# Patient Record
Sex: Female | Born: 1935 | Race: White | Hispanic: No | Marital: Married | Smoking: Former smoker
Health system: Southern US, Community
[De-identification: ages and names within clinical notes are randomized; demographics above are authoritative.]

## PROBLEM LIST (undated history)

## (undated) DIAGNOSIS — K219 Gastro-esophageal reflux disease without esophagitis: Secondary | ICD-10-CM

## (undated) DIAGNOSIS — I10 Essential (primary) hypertension: Secondary | ICD-10-CM

## (undated) DIAGNOSIS — E785 Hyperlipidemia, unspecified: Secondary | ICD-10-CM

## (undated) DIAGNOSIS — E039 Hypothyroidism, unspecified: Secondary | ICD-10-CM

## (undated) HISTORY — PX: RETINAL TEAR REPAIR CRYOTHERAPY: SHX5304

## (undated) HISTORY — PX: CATARACT EXTRACTION: SUR2

## (undated) HISTORY — PX: BACK SURGERY: SHX140

## (undated) HISTORY — PX: ABDOMINAL HYSTERECTOMY: SHX81

---

## 2009-08-07 ENCOUNTER — Inpatient Hospital Stay: Payer: Self-pay | Admitting: Specialist

## 2011-05-26 ENCOUNTER — Ambulatory Visit: Payer: Self-pay

## 2013-07-26 ENCOUNTER — Emergency Department: Payer: Self-pay | Admitting: Emergency Medicine

## 2013-07-26 LAB — COMPREHENSIVE METABOLIC PANEL
Albumin: 3.8 g/dL (ref 3.4–5.0)
Alkaline Phosphatase: 61 U/L (ref 50–136)
Anion Gap: 4 — ABNORMAL LOW (ref 7–16)
BUN: 18 mg/dL (ref 7–18)
Bilirubin,Total: 0.4 mg/dL (ref 0.2–1.0)
Calcium, Total: 10.3 mg/dL — ABNORMAL HIGH (ref 8.5–10.1)
Chloride: 105 mmol/L (ref 98–107)
Co2: 28 mmol/L (ref 21–32)
Creatinine: 1.34 mg/dL — ABNORMAL HIGH (ref 0.60–1.30)
EGFR (African American): 44 — ABNORMAL LOW
Glucose: 113 mg/dL — ABNORMAL HIGH (ref 65–99)
Osmolality: 277 (ref 275–301)
Potassium: 4.3 mmol/L (ref 3.5–5.1)
Total Protein: 7.6 g/dL (ref 6.4–8.2)

## 2013-07-26 LAB — URINALYSIS, COMPLETE
Bilirubin,UR: NEGATIVE
Blood: NEGATIVE
Ketone: NEGATIVE
Nitrite: NEGATIVE
Ph: 6 (ref 4.5–8.0)
RBC,UR: 2 /HPF (ref 0–5)
Specific Gravity: 1.01 (ref 1.003–1.030)
WBC UR: 17 /HPF (ref 0–5)

## 2013-07-26 LAB — CBC
HCT: 35.8 % (ref 35.0–47.0)
MCHC: 35 g/dL (ref 32.0–36.0)
MCV: 96 fL (ref 80–100)
Platelet: 221 10*3/uL (ref 150–440)
RBC: 3.74 10*6/uL — ABNORMAL LOW (ref 3.80–5.20)

## 2013-07-26 LAB — CK TOTAL AND CKMB (NOT AT ARMC): CK-MB: 1.6 ng/mL (ref 0.5–3.6)

## 2013-11-15 HISTORY — PX: OTHER SURGICAL HISTORY: SHX169

## 2013-12-14 ENCOUNTER — Ambulatory Visit: Payer: Self-pay

## 2014-02-06 ENCOUNTER — Ambulatory Visit: Payer: Self-pay | Admitting: General Practice

## 2014-02-06 LAB — PROTIME-INR
INR: 1
Prothrombin Time: 12.9 secs (ref 11.5–14.7)

## 2014-02-06 LAB — BASIC METABOLIC PANEL
Anion Gap: 4 — ABNORMAL LOW (ref 7–16)
BUN: 24 mg/dL — AB (ref 7–18)
Calcium, Total: 10 mg/dL (ref 8.5–10.1)
Chloride: 106 mmol/L (ref 98–107)
Co2: 28 mmol/L (ref 21–32)
Creatinine: 1.41 mg/dL — ABNORMAL HIGH (ref 0.60–1.30)
EGFR (African American): 41 — ABNORMAL LOW
EGFR (Non-African Amer.): 36 — ABNORMAL LOW
GLUCOSE: 92 mg/dL (ref 65–99)
OSMOLALITY: 279 (ref 275–301)
Potassium: 4.2 mmol/L (ref 3.5–5.1)
Sodium: 138 mmol/L (ref 136–145)

## 2014-02-06 LAB — URINALYSIS, COMPLETE
BILIRUBIN, UR: NEGATIVE
Bacteria: NONE SEEN
Blood: NEGATIVE
Glucose,UR: NEGATIVE mg/dL (ref 0–75)
KETONE: NEGATIVE
Leukocyte Esterase: NEGATIVE
Nitrite: NEGATIVE
Ph: 6 (ref 4.5–8.0)
Protein: NEGATIVE
RBC, UR: NONE SEEN /HPF (ref 0–5)
Specific Gravity: 1.006 (ref 1.003–1.030)
Squamous Epithelial: <1

## 2014-02-06 LAB — MRSA PCR SCREENING

## 2014-02-06 LAB — CBC
HCT: 35.2 % (ref 35.0–47.0)
HGB: 11.9 g/dL — AB (ref 12.0–16.0)
MCH: 32.9 pg (ref 26.0–34.0)
MCHC: 34 g/dL (ref 32.0–36.0)
MCV: 97 fL (ref 80–100)
Platelet: 198 10*3/uL (ref 150–440)
RBC: 3.64 10*6/uL — AB (ref 3.80–5.20)
RDW: 13.4 % (ref 11.5–14.5)
WBC: 7.7 10*3/uL (ref 3.6–11.0)

## 2014-02-06 LAB — SEDIMENTATION RATE: Erythrocyte Sed Rate: 42 mm/hr — ABNORMAL HIGH (ref 0–30)

## 2014-02-06 LAB — APTT: Activated PTT: 33.1 secs (ref 23.6–35.9)

## 2014-02-08 LAB — URINE CULTURE

## 2014-02-20 ENCOUNTER — Inpatient Hospital Stay: Payer: Self-pay | Admitting: General Practice

## 2014-02-21 LAB — BASIC METABOLIC PANEL
Anion Gap: 5 — ABNORMAL LOW (ref 7–16)
BUN: 24 mg/dL — AB (ref 7–18)
CALCIUM: 8.9 mg/dL (ref 8.5–10.1)
Chloride: 105 mmol/L (ref 98–107)
Co2: 26 mmol/L (ref 21–32)
Creatinine: 1.41 mg/dL — ABNORMAL HIGH (ref 0.60–1.30)
EGFR (African American): 41 — ABNORMAL LOW
EGFR (Non-African Amer.): 36 — ABNORMAL LOW
GLUCOSE: 115 mg/dL — AB (ref 65–99)
OSMOLALITY: 277 (ref 275–301)
POTASSIUM: 4.1 mmol/L (ref 3.5–5.1)
SODIUM: 136 mmol/L (ref 136–145)

## 2014-02-21 LAB — PLATELET COUNT: PLATELETS: 177 10*3/uL (ref 150–440)

## 2014-02-21 LAB — HEMOGLOBIN: HGB: 9.8 g/dL — ABNORMAL LOW (ref 12.0–16.0)

## 2014-02-22 LAB — BASIC METABOLIC PANEL
ANION GAP: 6 — AB (ref 7–16)
BUN: 23 mg/dL — ABNORMAL HIGH (ref 7–18)
CO2: 26 mmol/L (ref 21–32)
Calcium, Total: 9.3 mg/dL (ref 8.5–10.1)
Chloride: 108 mmol/L — ABNORMAL HIGH (ref 98–107)
Creatinine: 1.28 mg/dL (ref 0.60–1.30)
EGFR (Non-African Amer.): 40 — ABNORMAL LOW
GFR CALC AF AMER: 46 — AB
Glucose: 86 mg/dL (ref 65–99)
Osmolality: 282 (ref 275–301)
Potassium: 4.5 mmol/L (ref 3.5–5.1)
SODIUM: 140 mmol/L (ref 136–145)

## 2014-02-22 LAB — PLATELET COUNT: Platelet: 173 10*3/uL (ref 150–440)

## 2014-02-22 LAB — HEMOGLOBIN: HGB: 9.8 g/dL — ABNORMAL LOW (ref 12.0–16.0)

## 2015-03-08 NOTE — Op Note (Signed)
PATIENT NAME:  Gail Mora, Gail Mora MR#:  [id number] DATE OF BIRTH:  1933/02/27  DATE OF PROCEDURE:  02/20/2014  PREOPERATIVE DIAGNOSIS: Degenerative arthrosis of the left knee.   POSTOPERATIVE DIAGNOSIS: Degenerative arthrosis of the left knee.   PROCEDURE PERFORMED: Left total knee arthroplasty using computer-assisted navigation.   SURGEON: Illene LabradorJames P. Angie FavaHooten Jr., MD  ASSISTANT: Cranston Neighborhris Gaines, PA-C (required to maintain retraction throughout the procedure)   ANESTHESIA: Spinal.   ESTIMATED BLOOD LOSS: 50 mL.   FLUIDS REPLACED: 1300 mL of crystalloid.   TOURNIQUET TIME: 96 minutes.   DRAINS: Two medium drains to reinfusion system.   SOFT TISSUE RELEASES: Anterior cruciate ligament, posterior cruciate ligament, deep and superficial medial collateral ligament and patellofemoral ligament.   IMPLANTS UTILIZED: DePuy PFC Sigma size 2.5 posterior stabilized femoral component (cemented), size 3 MBT tibial component (cemented), 32 mm 3 peg oval dome patella (cemented), and 10 mm stabilized rotating platform polyethylene insert.   INDICATIONS FOR SURGERY: The patient is a 81 year old female who has been seen for complaints of progressive left knee pain. X-rays demonstrated severe degenerative changes in a tricompartmental fashion with relative varus deformity. After discussion of the risks and benefits of surgical intervention, the patient expressed understanding of the risks and benefits and agreed with plans for surgical intervention.   PROCEDURE IN DETAIL: The patient was brought into the operating room and, after adequate spinal anesthesia was achieved, a tourniquet was placed on the patient's upper left thigh. The patient's left knee and leg were cleaned and prepped with alcohol and DuraPrep and draped in the usual sterile fashion. A "timeout" was performed as per usual protocol. The left lower extremity was exsanguinated using an Esmarch, and the tourniquet was inflated to 300 mmHg. An anterior  longitudinal incision was made followed by a standard mid vastus approach. A moderate effusion was evacuated. The deep fibers of the medial collateral ligament were elevated in a subperiosteal fashion off the medial flare of the tibia so as to maintain a continuous soft tissue sleeve. The patella was subluxed laterally, and the patellofemoral ligament was incised. Inspection of the knee demonstrated severe degenerative changes with full-thickness loss of articular cartilage. Prominent osteophytes were debrided using a rongeur. Anterior and posterior cruciate ligaments were excised. Two 4.0 mm Schanz pins were inserted into the femur and into the tibia for attachment of the array of trackers used for computer-assisted navigation. Hip center was identified using the circumduction technique. Distal landmarks were mapped using the computer. The distal femur and proximal tibia were mapped using the computer. Distal femoral cutting guide was positioned using computer-assisted navigation so as to achieve a 5 degree distal valgus cut. Cut was performed and verified using the computer. Distal femur was sized, and it was felt that a size 2.5 femoral component was appropriate. A size 2.5 cutting guide was positioned, and anterior cut was performed and verified using the computer. This was followed by completion of the posterior and chamfer cuts. Femoral cutting guide for a central box was then positioned, and the central box cut was performed. Attention was then directed to the proximal tibia. Medial and lateral menisci were excised. The extramedullary tibial cutting guide was positioned so as to achieve 0 degree varus and valgus alignment and 0 degree posterior slope using computer navigation. The proximal tibia was cut and confirmed using the computer. The proximal tibia was sized, and it was felt that a size 3 tibial tray was appropriate. Tibial and femoral trials were positioned. Posterior osteophytes off of  the femoral  condyles were removed using curved osteotomes. A 10 mm polyethylene trial was inserted, and the knee was placed through range of motion. The knee was felt to be tight medially. A Cobb elevator was used to elevate the superficial fibers of the medial collateral ligament. This allowed for excellent medial and lateral soft tissue balancing. Finally, the patella was cut and prepared so as to accommodate a 32 mm 3 peg oval dome patella. Patellar trial was placed, and the knee was placed through a range of motion, with excellent patellar tracking appreciated. The femoral trial was removed. Central post hole for the tibial component was reamed, followed by insertion of a keel punch. Tibial trial was then removed. The cut surfaces of bone were irrigated with copious amounts of normal saline with antibiotic solution using pulsatile lavage and then suctioned dry. Polymethyl methacrylate cement was prepared in the usual fashion using a vacuum mixer. Cement was applied to the cut surface of the proximal tibia as well as along the undersurface of a size 3 MBT tibial component. The tibial component was positioned and impacted into place. Excess cement was removed using Personal assistant. Cement was then applied to the cut surface of the femur as well as along the posterior flanges of a size 2.5 posterior stabilized femoral component. The femoral component was positioned and impacted into place. Excess cement was removed using Personal assistant. A 10 mm polyethylene trial was inserted, and the knee was brought into full extension with steady axial compression. Finally, cement was applied to the backside of a 32 mm 3 peg oval dome patella, and the patellar component was positioned and patellar clamp applied. Excess cement was removed using Personal assistant. After adequate curing of cement, the tourniquet was deflated after a total tourniquet time of 96 minutes. Hemostasis was achieved using electrocautery. The knee was irrigated with  copious amounts of normal saline with antibiotic solution using pulsatile lavage and then suctioned dry. The knee was inspected for any residual cement debris. Then, 20 mL of 1.3% Exparel in 40 mL of normal saline was injected along the posterior capsule, medial and lateral gutters and along the arthrotomy site. A 10 mm stabilized rotating platform polyethylene insert was inserted, and the knee was placed through a range of motion, with excellent patellar tracking appreciated and excellent medial and lateral soft tissue balancing noted. Two medium drains were placed in the wound bed and brought out through a separate stab incision to be attached to a reinfusion system. The medial parapatellar portion of the incision was reapproximated using interrupted sutures of #1 Vicryl. The subcutaneous tissue was approximated in layers using first #0 Vicryl, followed by 2-0 Vicryl. Then, 30 mL of 0.25% Marcaine with epinephrine was injected along the subcutaneous tissue along the incision site. The skin was reapproximated using skin staples. A sterile dressing was applied.   The patient tolerated the procedure well. She was transported to the recovery room in stable condition.   ____________________________ Illene Labrador. Angie Fava., MD jph:lb D: 02/21/2014 06:32:45 ET T: 02/21/2014 06:44:59 ET JOB#: [id number]  cc: Illene Labrador. Angie Fava., MD, <Dictator> Illene Labrador Angie Fava MD ELECTRONICALLY SIGNED 03/06/2014 11:33

## 2015-03-08 NOTE — Discharge Summary (Signed)
PATIENT NAME:  Gail Mora, Gail Mora MR#:  [id number] DATE OF BIRTH:  10-Nov-1933  DATE OF ADMISSION:  02/20/2014 DATE OF DISCHARGE:  02/23/2014  ADMITTING DIAGNOSIS:  Degenerative arthritis, left knee.   DISCHARGE DIAGNOSIS:  Degenerative arthritis, left knee.   PROCEDURE PERFORMED:  Left total knee arthroplasty using computer-assisted navigation.   SURGEON:  Laurice Record. Holley Bouche., M.D.   ASSISTANT:  Rachelle Hora, PA-C.   ANESTHESIA:  Spinal.   ESTIMATED BLOOD LOSS:  50 mL.   FLUIDS REPLACED:  1300 mL of crystalloid.   TOURNIQUET TIME:  96 minutes.   DRAINS:  Two medium drains to reinfusion system.   SOFT TISSUE RELEASES:  Anterior cruciate ligament, posterior cruciate ligament, deep and superficial medial collateral ligament and patellofemoral ligament.   IMPLANTS UTILIZED:  DePuy PFC Sigma size 2.5 posterior stabilized femoral component with a size three MBT tibial component and a 32 mm three peg oval dome patella, and 10 mm stabilized rotating platform polyethylene insert.   HISTORY:  The patient is a 81 year old female who presents today for history and physical.  She is to undergo left total knee arthroplasty on 02/20/2014.  She presented with orthopedics from Dr. Juanda Crumble Phillip's office.  She has had a three year history of progressive left knee pain.  Pain is aggravated by weight-bearing activities although she reports some nighttime pain.  She localizes most of the knee pain along the medial aspect of the knee.  She is not currently using any ambulatory aid.  She does report some occasional mild swelling of the knee, but denies any gross locking or giving way of the knee.  She has seen some slight symptomatic improvement with the use of an analgesic cream.  The pain has progressed to the point that it is significantly interfering with activities of daily living.   PHYSICAL EXAMINATION: GENERAL:  Well-developed, well-nourished female in no apparent distress.  Normal affect.  Antalgic  gait with varus thrust of the left knee.  HEENT:  Atraumatic, normocephalic.  Pupils are equal, round, reactive to light.  Extraocular motion is intact.  Sclerae is clear.  Oropharynx is clear with moist mucosa.  NECK:  Supple, nontender with good range of motion.  No thyromegaly, adenopathy, JVD or carotid bruits.  LUNGS:  Clear to auscultation bilaterally.  CARDIOVASCULAR:  Regular rate and rhythm.  Normal S1, S2.  2 out of 6 murmur.  No appreciable gallops or rubs.  Peripheral pulses are palpable.  No lower extremity edema.  Homans test is negative.  ABDOMEN:  Soft, nontender, nondistended.  Bowel sounds are present.  LEFT KNEE:  No swelling, warmth, erythema or effusion.  She has crepitus with range of motion tenderness along the medial joint line and she has a relative varus alignment.  She has medial joint line tenderness. She has no laxity with a negative anterior drawer test, Lachman's test as well as pivot shift test and McMurray's test.  She has no atrophy and good quadriceps tone.  Range of motion is 3 to 114 degrees and she is neurovascularly intact in the left lower extremity.   HOSPITAL COURSE:  The patient was admitted to the hospital 02/20/2014.  She had surgery that same day and was brought to the orthopedic floor from the PAC-U in stable condition.  On postoperative day 1 she was found to have acute postop blood loss anemia of 9.8 and on postoperative day 2, this stayed stable at 9.8.  Creatinine was slightly elevated, but was at normal for her baseline  at 1.41.  She continued to receive IV fluids and her creatinine was down to 1.28 on postoperative day 2.  The patient did make great progress with physical therapy on postoperative day 1 and 2 and on postoperative day 3, she met all physical therapy goals and was stable and ready for discharge to home with home health PT.   CONDITION AT DISCHARGE:  Stable.   DISCHARGE INSTRUCTIONS:  The patient may gradually increase weight-bearing on  the affected extremity, elevate the affected foot or leg on 1 or 2 pillows with the foot higher than the knee.  Thigh-high TED hose on both legs and remove at bedtime and replace when arising the next morning.  Elevate the heels off the bed.  Use incentive spirometry every hour while awake.  Encourage cough and deep breathing.  Continue using Polar Care unit, maintaining temp between 40 to 50 degrees.  Do not get the dressing or bandage wet or dirty.  Call [facility] ortho if the dressing gets water under it.  Leave the dressing on.  Call [facility] ortho if you experience any increased bleeding from the incision wound, fever above 101.5 degrees, redness, swelling, or drainage at the incision site.  Call [facility] ortho if any increased leg pain, numbness or weakness in her legs or bowel or bladder symptoms.  Home physical therapy has been arranged for continuation of rehab program.  Please call [place] ortho if a therapist has not contacted you within 48 hours of your return home.  Follow-up appointment on 03/07/2014 at 9:00 a.m. with Dr. Skip Estimable.   DISCHARGE MEDICATIONS:  See discharge medications from discharge instructions.    ____________________________ T. Rachelle Hora, PA-C tcg:ea D: 02/22/2014 19:39:25 ET T: 02/22/2014 23:53:07 ET JOB#: [id number]  cc: T. Rachelle Hora, PA-C, <Dictator> Duanne Guess [place] ELECTRONICALLY SIGNED 03/08/2014 8:09

## 2015-11-30 ENCOUNTER — Encounter: Payer: Self-pay | Admitting: Emergency Medicine

## 2015-11-30 ENCOUNTER — Emergency Department: Payer: Medicare Other

## 2015-11-30 ENCOUNTER — Emergency Department
Admission: EM | Admit: 2015-11-30 | Discharge: 2015-11-30 | Disposition: A | Discharge status: Home / Self Care | Payer: Medicare Other | Attending: Emergency Medicine | Admitting: Emergency Medicine

## 2015-11-30 DIAGNOSIS — R002 Palpitations: Secondary | ICD-10-CM | POA: Diagnosis not present

## 2015-11-30 DIAGNOSIS — F419 Anxiety disorder, unspecified: Secondary | ICD-10-CM | POA: Diagnosis not present

## 2015-11-30 DIAGNOSIS — R0602 Shortness of breath: Secondary | ICD-10-CM | POA: Diagnosis not present

## 2015-11-30 DIAGNOSIS — I10 Essential (primary) hypertension: Secondary | ICD-10-CM | POA: Insufficient documentation

## 2015-11-30 DIAGNOSIS — Z87891 Personal history of nicotine dependence: Secondary | ICD-10-CM | POA: Insufficient documentation

## 2015-11-30 HISTORY — DX: Essential (primary) hypertension: I10

## 2015-11-30 HISTORY — DX: Hyperlipidemia, unspecified: E78.5

## 2015-11-30 HISTORY — DX: Hypothyroidism, unspecified: E03.9

## 2015-11-30 HISTORY — DX: Gastro-esophageal reflux disease without esophagitis: K21.9

## 2015-11-30 LAB — COMPREHENSIVE METABOLIC PANEL
ALBUMIN: 4.1 g/dL (ref 3.5–5.0)
ALT: 11 U/L — ABNORMAL LOW (ref 14–54)
ANION GAP: 6 (ref 5–15)
AST: 18 U/L (ref 15–41)
Alkaline Phosphatase: 44 U/L (ref 38–126)
BUN: 27 mg/dL — AB (ref 6–20)
CALCIUM: 10.3 mg/dL (ref 8.9–10.3)
CHLORIDE: 106 mmol/L (ref 101–111)
CO2: 26 mmol/L (ref 22–32)
CREATININE: 1.43 mg/dL — AB (ref 0.44–1.00)
GFR calc Af Amer: 39 mL/min — ABNORMAL LOW (ref >60)
GFR, EST NON AFRICAN AMERICAN: 34 mL/min — AB (ref >60)
GLUCOSE: 109 mg/dL — AB (ref 65–99)
POTASSIUM: 5.1 mmol/L (ref 3.5–5.1)
Sodium: 138 mmol/L (ref 135–145)
TOTAL PROTEIN: 7.6 g/dL (ref 6.5–8.1)
Total Bilirubin: 0.7 mg/dL (ref 0.3–1.2)

## 2015-11-30 LAB — CBC
HCT: 34.3 % — ABNORMAL LOW (ref 35.0–47.0)
Hemoglobin: 11.5 g/dL — ABNORMAL LOW (ref 12.0–16.0)
MCH: 31.9 pg (ref 26.0–34.0)
MCHC: 33.6 g/dL (ref 32.0–36.0)
MCV: 94.8 fL (ref 80.0–100.0)
PLATELETS: 203 10*3/uL (ref 150–440)
RBC: 3.62 MIL/uL — ABNORMAL LOW (ref 3.80–5.20)
RDW: 13.5 % (ref 11.5–14.5)
WBC: 7.6 10*3/uL (ref 3.6–11.0)

## 2015-11-30 LAB — URINALYSIS COMPLETE WITH MICROSCOPIC (ARMC ONLY)
BILIRUBIN URINE: NEGATIVE
GLUCOSE, UA: NEGATIVE mg/dL
HGB URINE DIPSTICK: NEGATIVE
Ketones, ur: NEGATIVE mg/dL
Leukocytes, UA: NEGATIVE
Nitrite: NEGATIVE
Protein, ur: NEGATIVE mg/dL
RBC / HPF: NONE SEEN RBC/hpf (ref 0–5)
Specific Gravity, Urine: 1.006 (ref 1.005–1.030)
pH: 6 (ref 5.0–8.0)

## 2015-11-30 LAB — TROPONIN I

## 2015-11-30 NOTE — ED Notes (Signed)
Pt states feels jittery and "weird". Denies chest pain. Pt states occasionally feels "chest flutters". Denies numbness or tingling of extremities. Denies dizziness or lightheadedness. Denies N/V/D. States she feels anxious because she does not know what is making her feel this way but denies anxiety prior to symptom onset.

## 2015-11-30 NOTE — ED Provider Notes (Signed)
[facility] Emergency Department Provider Note  Time seen: 12:13 PM  I have reviewed the triage vital signs and the nursing notes.   HISTORY  Chief Complaint Shortness of Breath    HPI Gail Mora is a 82 y.o. female with a past medical history of hypertension, hypothyroidism, hyperlipidemia, gastric reflux who presents the emergency department with a "weird feeling." According to the patient this morning around 8:15 she was sitting in her chair when she began feeling jittery, anxious, and said she felt a few palpitations. She states she got up and showered trying to get ready for the day but continued to feel "weird." Patient denies any pain, specifically denies chest pain, trouble breathing, abdominal pain, nausea, vomiting, diarrhea, dysuria. Patient states she had a similar symptom approximately one year ago and ended up being diagnosed with urinary tract infection.  Patient went to a fire department nearby, they told her that her blood pressure was elevated and referred her to the emergency department. Patient states she still feels a little jittery but much better than earlier today. States she just wanted to be safe so she came for an evaluation.       Past Medical History  Diagnosis Date  . Hypertension   . Hypothyroid   . Hyperlipemia   . GERD (gastroesophageal reflux disease)     There are no active problems to display for this patient.   Past Surgical History  Procedure Laterality Date  . Knee replacemt  2015    Left knee  . Abdominal hysterectomy      No current outpatient prescriptions on file.  Allergies Review of patient's allergies indicates no known allergies.  No family history on file.  Social History Social History  Substance Use Topics  . Smoking status: Former Games developermoker  . Smokeless tobacco: Never Used  . Alcohol Use: No    Review of Systems Constitutional: Negative for fever. Cardiovascular: Negative for chest  pain. Respiratory: Negative for shortness of breath. Gastrointestinal: Negative for abdominal pain, vomiting and diarrhea. Genitourinary: Negative for dysuria. Neurological: Negative for headache 10-point ROS otherwise negative.  ____________________________________________   PHYSICAL EXAM:  VITAL SIGNS: ED Triage Vitals  Enc Vitals Group     BP 11/30/15 1006 159/78 mmHg     Pulse Rate 11/30/15 1006 68     Resp 11/30/15 1006 18     Temp 11/30/15 1006 97.8 F (36.6 C)     Temp Source 11/30/15 1006 Oral     SpO2 11/30/15 1006 97 %     Weight 11/30/15 1006 148 lb (67.132 kg)     Height 11/30/15 1006 5\' 2"  (1.575 m)     Head Cir --      Peak Flow --      Pain Score --      Pain Loc --      Pain Edu? --      Excl. in GC? --     Constitutional: Alert and oriented. Well appearing and in no distress. Eyes: Normal exam ENT   Head: Normocephalic and atraumatic.   Mouth/Throat: Mucous membranes are moist. Cardiovascular: Normal rate, regular rhythm. Respiratory: Normal respiratory effort without tachypnea nor retractions. Breath sounds are clear and equal bilaterally. No wheezes/rales/rhonchi. Gastrointestinal: Soft and nontender. No distention.  Musculoskeletal: Nontender with normal range of motion in all extremities. No lower extremity tenderness or edema. Neurologic:  Normal speech and language. No gross focal neurologic deficits Skin:  Skin is warm, dry and intact.  Psychiatric:  Mood and affect are normal. Speech and behavior are normal.  ____________________________________________    EKG  EKG reviewed and interpreted by myself shows sinus rhythm at 61 bpm, narrow QRS, normal axis, normal intervals, no ST changes. Overall normal EKG besides occasional PVC.  ____________________________________________    RADIOLOGY  chest x-ray shows no acute abnormality  INITIAL IMPRESSION / ASSESSMENT AND PLAN / ED COURSE  Pertinent labs & imaging results that were  available during my care of the patient were reviewed by me and considered in my medical decision making (see chart for details).   patient presents for a weird sensation is morning. On review of systems denies nearly everything including chest pain, shortness of breath, weakness or numbness, headache. Patient's labs are within normal limits besides a slight hyperkalemia. Given the patient's onset at 8:15 AM we will repeat a troponin now as it has been 2 hours since the first lab was drawn. We will also obtain a urinalysis given the patient experienced similar symptoms one year ago with urinary tract infection. Overall the patient appears very well, with a normal exam.  Repeat troponin and urinalysis normal. We will discharge the patient home with primary care follow-up. The patient is agreeable to plan. ____________________________________________   FINAL CLINICAL IMPRESSION(S) / ED DIAGNOSES  Palpitations   Minna Antis, MD 11/30/15 [phone]

## 2015-11-30 NOTE — Discharge Instructions (Signed)
You have been seen in the emergency department today, your workup has shown normal results. You do have an occasional ectopic beat on your EKG which is likely the heart fluttering sensation you are experiencing. Labs are normal, your chest x-ray is normal. Please follow-up with her primary care physician in one to 2 days for recheck/reevaluation. Return to the emergency department for any further concerning symptoms, especially for any chest pain or trouble breathing.   Palpitations A palpitation is the feeling that your heartbeat is irregular or is faster than normal. It may feel like your heart is fluttering or skipping a beat. Palpitations are usually not a serious problem. However, in some cases, you may need further medical evaluation. CAUSES  Palpitations can be caused by:  Smoking.  Caffeine or other stimulants, such as diet pills or energy drinks.  Alcohol.  Stress and anxiety.  Strenuous physical activity.  Fatigue.  Certain medicines.  Heart disease, especially if you have a history of irregular heart rhythms (arrhythmias), such as atrial fibrillation, atrial flutter, or supraventricular tachycardia.  An improperly working pacemaker or defibrillator. DIAGNOSIS  To find the cause of your palpitations, your health care provider will take your medical history and perform a physical exam. Your health care provider may also have you take a test called an ambulatory electrocardiogram (ECG). An ECG records your heartbeat patterns over a 24-hour period. You may also have other tests, such as:  Transthoracic echocardiogram (TTE). During echocardiography, sound waves are used to evaluate how blood flows through your heart.  Transesophageal echocardiogram (TEE).  Cardiac monitoring. This allows your health care provider to monitor your heart rate and rhythm in real time.  Holter monitor. This is a portable device that records your heartbeat and can help diagnose heart arrhythmias. It  allows your health care provider to track your heart activity for several days, if needed.  Stress tests by exercise or by giving medicine that makes the heart beat faster. TREATMENT  Treatment of palpitations depends on the cause of your symptoms and can vary greatly. Most cases of palpitations do not require any treatment other than time, relaxation, and monitoring your symptoms. Other causes, such as atrial fibrillation, atrial flutter, or supraventricular tachycardia, usually require further treatment. HOME CARE INSTRUCTIONS   Avoid:  Caffeinated coffee, tea, soft drinks, diet pills, and energy drinks.  Chocolate.  Alcohol.  Stop smoking if you smoke.  Reduce your stress and anxiety. Things that can help you relax include:  A method of controlling things in your body, such as your heartbeats, with your mind (biofeedback).  Yoga.  Meditation.  Physical activity such as swimming, jogging, or walking.  Get plenty of rest and sleep. SEEK MEDICAL CARE IF:   You continue to have a fast or irregular heartbeat beyond 24 hours.  Your palpitations occur more often. SEEK IMMEDIATE MEDICAL CARE IF:  You have chest pain or shortness of breath.  You have a severe headache.  You feel dizzy or you faint. MAKE SURE YOU:  Understand these instructions.  Will watch your condition.  Will get help right away if you are not doing well or get worse.   This information is not intended to replace advice given to you by your health care provider. Make sure you discuss any questions you have with your health care provider.   Document Released: 10/29/2000 Document Revised: 11/06/2013 Document Reviewed: 12/31/2011 Elsevier Interactive Patient Education [employer]

## 2015-11-30 NOTE — ED Notes (Signed)
Pt reports SOB that started after you woke up yesterday. Pt states she also felt palpitations at the time. She states " I just  feel weird"  denies chest pain.

## 2015-12-31 DIAGNOSIS — H35342 Macular cyst, hole, or pseudohole, left eye: Secondary | ICD-10-CM | POA: Diagnosis not present

## 2016-01-05 DIAGNOSIS — I119 Hypertensive heart disease without heart failure: Secondary | ICD-10-CM | POA: Diagnosis not present

## 2016-01-05 DIAGNOSIS — E034 Atrophy of thyroid (acquired): Secondary | ICD-10-CM | POA: Diagnosis not present

## 2016-01-05 DIAGNOSIS — E784 Other hyperlipidemia: Secondary | ICD-10-CM | POA: Diagnosis not present

## 2016-01-12 DIAGNOSIS — E785 Hyperlipidemia, unspecified: Secondary | ICD-10-CM | POA: Diagnosis not present

## 2016-01-12 DIAGNOSIS — E039 Hypothyroidism, unspecified: Secondary | ICD-10-CM | POA: Diagnosis not present

## 2016-04-28 DIAGNOSIS — J45909 Unspecified asthma, uncomplicated: Secondary | ICD-10-CM | POA: Diagnosis not present

## 2016-04-28 DIAGNOSIS — J449 Chronic obstructive pulmonary disease, unspecified: Secondary | ICD-10-CM | POA: Diagnosis not present

## 2016-04-28 DIAGNOSIS — J219 Acute bronchiolitis, unspecified: Secondary | ICD-10-CM | POA: Diagnosis not present

## 2016-04-28 DIAGNOSIS — E784 Other hyperlipidemia: Secondary | ICD-10-CM | POA: Diagnosis not present

## 2016-05-05 DIAGNOSIS — E785 Hyperlipidemia, unspecified: Secondary | ICD-10-CM | POA: Diagnosis not present

## 2016-05-05 DIAGNOSIS — E039 Hypothyroidism, unspecified: Secondary | ICD-10-CM | POA: Diagnosis not present

## 2016-05-09 ENCOUNTER — Emergency Department
Admission: EM | Admit: 2016-05-09 | Discharge: 2016-05-09 | Disposition: A | Discharge status: Home / Self Care | Payer: Medicare Other | Attending: Student | Admitting: Student

## 2016-05-09 ENCOUNTER — Emergency Department: Payer: Medicare Other

## 2016-05-09 ENCOUNTER — Encounter: Payer: Self-pay | Admitting: Emergency Medicine

## 2016-05-09 ENCOUNTER — Other Ambulatory Visit: Payer: Self-pay

## 2016-05-09 DIAGNOSIS — E785 Hyperlipidemia, unspecified: Secondary | ICD-10-CM | POA: Diagnosis not present

## 2016-05-09 DIAGNOSIS — R05 Cough: Secondary | ICD-10-CM | POA: Diagnosis not present

## 2016-05-09 DIAGNOSIS — Z87891 Personal history of nicotine dependence: Secondary | ICD-10-CM | POA: Insufficient documentation

## 2016-05-09 DIAGNOSIS — I10 Essential (primary) hypertension: Secondary | ICD-10-CM | POA: Insufficient documentation

## 2016-05-09 DIAGNOSIS — M25511 Pain in right shoulder: Secondary | ICD-10-CM | POA: Insufficient documentation

## 2016-05-09 DIAGNOSIS — R0789 Other chest pain: Secondary | ICD-10-CM | POA: Diagnosis not present

## 2016-05-09 DIAGNOSIS — R079 Chest pain, unspecified: Secondary | ICD-10-CM | POA: Diagnosis not present

## 2016-05-09 DIAGNOSIS — E039 Hypothyroidism, unspecified: Secondary | ICD-10-CM | POA: Insufficient documentation

## 2016-05-09 DIAGNOSIS — R0602 Shortness of breath: Secondary | ICD-10-CM | POA: Diagnosis not present

## 2016-05-09 LAB — CBC
HEMATOCRIT: 30.9 % — AB (ref 35.0–47.0)
HEMOGLOBIN: 10.4 g/dL — AB (ref 12.0–16.0)
MCH: 31.5 pg (ref 26.0–34.0)
MCHC: 33.7 g/dL (ref 32.0–36.0)
MCV: 93.2 fL (ref 80.0–100.0)
Platelets: 242 10*3/uL (ref 150–440)
RBC: 3.31 MIL/uL — ABNORMAL LOW (ref 3.80–5.20)
RDW: 13.9 % (ref 11.5–14.5)
WBC: 7.8 10*3/uL (ref 3.6–11.0)

## 2016-05-09 LAB — BASIC METABOLIC PANEL
ANION GAP: 8 (ref 5–15)
BUN: 21 mg/dL — AB (ref 6–20)
CO2: 27 mmol/L (ref 22–32)
Calcium: 11 mg/dL — ABNORMAL HIGH (ref 8.9–10.3)
Chloride: 103 mmol/L (ref 101–111)
Creatinine, Ser: 1.36 mg/dL — ABNORMAL HIGH (ref 0.44–1.00)
GFR calc Af Amer: 41 mL/min — ABNORMAL LOW (ref >60)
GFR, EST NON AFRICAN AMERICAN: 35 mL/min — AB (ref >60)
GLUCOSE: 104 mg/dL — AB (ref 65–99)
POTASSIUM: 4.3 mmol/L (ref 3.5–5.1)
Sodium: 138 mmol/L (ref 135–145)

## 2016-05-09 LAB — TROPONIN I
Troponin I: <0.03 ng/mL (ref <0.031)
Troponin I: <0.03 ng/mL (ref <0.031)

## 2016-05-09 LAB — URINALYSIS COMPLETE WITH MICROSCOPIC (ARMC ONLY)
BACTERIA UA: NONE SEEN
Bilirubin Urine: NEGATIVE
Glucose, UA: NEGATIVE mg/dL
Hgb urine dipstick: NEGATIVE
KETONES UR: NEGATIVE mg/dL
Nitrite: NEGATIVE
PH: 7 (ref 5.0–8.0)
PROTEIN: NEGATIVE mg/dL
RBC / HPF: NONE SEEN RBC/hpf (ref 0–5)
SQUAMOUS EPITHELIAL / LPF: NONE SEEN
Specific Gravity, Urine: 1.006 (ref 1.005–1.030)
WBC UA: NONE SEEN WBC/hpf (ref 0–5)

## 2016-05-09 MED ORDER — IBUPROFEN 400 MG PO TABS
400.0000 mg | ORAL_TABLET | Freq: Once | ORAL | Status: DC
  Filled 2016-05-09: qty 1

## 2016-05-09 MED ORDER — ASPIRIN 81 MG PO CHEW
324.0000 mg | CHEWABLE_TABLET | Freq: Once | ORAL | Status: AC
  Administered 2016-05-09: 243 mg via ORAL
  Filled 2016-05-09: qty 4

## 2016-05-09 MED ORDER — ACETAMINOPHEN 325 MG PO TABS
650.0000 mg | ORAL_TABLET | Freq: Once | ORAL | Status: AC
  Administered 2016-05-09: 650 mg via ORAL
  Filled 2016-05-09: qty 2

## 2016-05-09 NOTE — ED Provider Notes (Signed)
[facility] Emergency Department Provider Note   ____________________________________________  Time seen: Approximately 3:04 PM  I have reviewed the triage vital signs and the nursing notes.   HISTORY  Chief Complaint Chest Pain and Shortness of Breath    HPI Gail Mora is a 82 y.o. female with thyroid disorder, hypertension, hyperlipidemia and GERD who presents for evaluation of 2-3 days of intermittent right shoulder pain and one day of right upper chest pain today gradual onset, worse with movement, mild to moderate. Patient reports that over the past 3 days she has had right shoulder pain worse with movement which she attributes to arthritis. Today she began having pain in the right upper chest wall while she was fixing her hair and getting ready to go to church. She reports that the pain is achy, typically lasts for only a few seconds and then goes away. It is not worsened with exertion, is not pleuritic, does not radiate to the back or down towards the feet and is not ripping or tearing in nature. No sudden nausea or diaphoresis. She reports she has been short of breath over the past week but she attributes that to the fact that she has been having productive cough and having nasal congestion and she just finished a Z-Pak for "congestion". She denies any fevers. No abdominal pain, no vomiting or diarrhea. No history of coronary artery disease.   Past Medical History  Diagnosis Date  . Hypertension   . Hypothyroid   . Hyperlipemia   . GERD (gastroesophageal reflux disease)     There are no active problems to display for this patient.   Past Surgical History  Procedure Laterality Date  . Knee replacemt  2015    Left knee  . Abdominal hysterectomy      No current outpatient prescriptions on file.  Allergies Review of patient's allergies indicates no known allergies.  No family history on file.  Social History Social History  Substance  Use Topics  . Smoking status: Former Games developer  . Smokeless tobacco: Never Used  . Alcohol Use: No    Review of Systems Constitutional: No fever/chills Eyes: No visual changes. ENT: No sore throat. Cardiovascular: + chest pain. Respiratory: + shortness of breath. Gastrointestinal: No abdominal pain.  No nausea, no vomiting.  No diarrhea.  No constipation. Genitourinary: Negative for dysuria. Musculoskeletal: Negative for back pain. Skin: Negative for rash. Neurological: Negative for headaches, focal weakness or numbness.  10-point ROS otherwise negative.  ____________________________________________   PHYSICAL EXAM:  VITAL SIGNS: ED Triage Vitals  Enc Vitals Group     BP 05/09/16 1433 154/67 mmHg     Pulse Rate 05/09/16 1433 59     Resp 05/09/16 1433 19     Temp --      Temp src --      SpO2 05/09/16 1433 100 %     Weight --      Height --      Head Cir --      Peak Flow --      Pain Score 05/09/16 1353 0     Pain Loc --      Pain Edu? --      Excl. in GC? --     Constitutional: Alert and oriented. Well appearing and in no acute distress. Eyes: Conjunctivae are normal. PERRL. EOMI. Head: Atraumatic. Nose: No congestion/rhinnorhea. Mouth/Throat: Mucous membranes are moist.  Oropharynx non-erythematous. Neck: No stridor.  Supple without meningismus. Cardiovascular: Normal rate,  regular rhythm. Grossly normal heart sounds.  Good peripheral circulation. Respiratory: Normal respiratory effort.  No retractions. Lungs CTAB. Gastrointestinal: Soft and nontender. No distention.  No CVA tenderness. Genitourinary: deferred Musculoskeletal: No lower extremity tenderness nor edema.  No joint effusions. swelling/tenderness or asymmetry. There is significant point tenderness to palpation throughout the right anterior shoulder and just adjacent to the shoulder in the right upper chest wall, when I palpate the area the patient grimaces in pain and attempts to move away from the  painful stimulus. 2+ radial pulses bilaterally. Full though moderately painful range of motion at the right shoulder. Neurologic:  Normal speech and language. No gross focal neurologic deficits are appreciated. No gait instability. Skin:  Skin is warm, dry and intact. No rash noted. Psychiatric: Mood and affect are normal. Speech and behavior are normal.  ____________________________________________   LABS (all labs ordered are listed, but only abnormal results are displayed)  Labs Reviewed  BASIC METABOLIC PANEL - Abnormal; Notable for the following:    Glucose, Bld 104 (*)    BUN 21 (*)    Creatinine, Ser 1.36 (*)    Calcium 11.0 (*)    GFR calc non Af Amer 35 (*)    GFR calc Af Amer 41 (*)    All other components within normal limits  CBC - Abnormal; Notable for the following:    RBC 3.31 (*)    Hemoglobin 10.4 (*)    HCT 30.9 (*)    All other components within normal limits  URINALYSIS COMPLETEWITH MICROSCOPIC ([facility] ONLY) - Abnormal; Notable for the following:    Color, Urine STRAW (*)    APPearance CLEAR (*)    Leukocytes, UA 1+ (*)    All other components within normal limits  URINE CULTURE  TROPONIN I  TROPONIN I   ____________________________________________  EKG  ED ECG REPORT I, Gayla DossGayle, Khristen Cheyney A, the attending physician, personally viewed and interpreted this ECG.   Date: 05/09/2016  EKG Time: 13:55  Rate: 67  Rhythm: normal EKG, normal sinus rhythm with sinus arrhythmia.  Axis: normal  Intervals:none  ST&T Change: No acute ST elevation.  ____________________________________________  RADIOLOGY  CXR IMPRESSION: No acute cardiopulmonary process. ____________________________________________   PROCEDURES  Procedure(s) performed: None  Critical Care performed: No  ____________________________________________   INITIAL IMPRESSION / ASSESSMENT AND PLAN / ED COURSE  Pertinent labs & imaging results that were available during my care of the patient  were reviewed by me and considered in my medical decision making (see chart for details).  Gail Mora is a 82 y.o. female with thyroid disorder, hypertension, hyperlipidemia and GERD who presents for evaluation of 2-3 days of intermittent right shoulder pain and one day of right upper chest pain today gradual onset. On exam, she is very well-appearing and in no acute distress. Her vital signs are stable and she is afebrile. During my interview, she denied any pain however she reported to me that after I examined her and pushed on her right upper chest wall, that's when she developed pain. I suspect her pain is musculoskeletal in nature, it is certainly worse with movement and she has reproducible point tenderness. Her EKG is normal, not consistent with acute ischemia and her clinical picture would be quite atypical for ACS however given her age and risk factors for same, we'll obtain 2 sets of cardiac enzymes. We'll obtain screening labs, chest x-ray, give aspirin. Clinical picture is not consistent with acute aortic dissection or pulmonary embolism. Reassess for disposition.  -----------------------------------------  5:27 PM on 05/09/2016 ----------------------------------------- Patient continues to rest comfortably, she is in no distress and has no complaints. Pain improved with Motrin and Tylenol. Chest x-ray with no acute cardiopulmonary disease. I reviewed her labs which show chronic stable abnormalities including mild creatinine elevation and anemia though her creatinine and hemoglobin levels appear close to what they were when her levels were checked in January of this year. Urinalysis with 1+ leukocytes but otherwise not consistent with infection and she has no urinary complaints, would send culture and wait to treat if her cultures come back positive. 2 sets of troponins are negative, I doubt that this represents ACS. We discussed return precautions, need for close PCP follow-up and she is  comfortable with the discharge plan. DC home. ____________________________________________   FINAL CLINICAL IMPRESSION(S) / ED DIAGNOSES  Final diagnoses:  Right shoulder pain  Right-sided chest wall pain      NEW MEDICATIONS STARTED DURING THIS VISIT:  New Prescriptions   No medications on file     Note:  This document was prepared using Dragon voice recognition software and may include unintentional dictation errors.    Gayla DossEryka A Jacobi Ryant, MD 05/09/16 [phone]

## 2016-05-09 NOTE — ED Notes (Signed)
Pt presents to ED with reports of right chest pain that radiates to her right arm. Pt reports SOB. Pt states pain comes and goes.

## 2016-05-10 LAB — URINE CULTURE

## 2016-05-11 DIAGNOSIS — E039 Hypothyroidism, unspecified: Secondary | ICD-10-CM | POA: Diagnosis not present

## 2016-05-11 DIAGNOSIS — E785 Hyperlipidemia, unspecified: Secondary | ICD-10-CM | POA: Diagnosis not present

## 2016-05-11 DIAGNOSIS — R0789 Other chest pain: Secondary | ICD-10-CM | POA: Diagnosis not present

## 2016-05-25 DIAGNOSIS — J45909 Unspecified asthma, uncomplicated: Secondary | ICD-10-CM | POA: Diagnosis not present

## 2016-05-25 DIAGNOSIS — R079 Chest pain, unspecified: Secondary | ICD-10-CM | POA: Diagnosis not present

## 2016-05-25 DIAGNOSIS — J219 Acute bronchiolitis, unspecified: Secondary | ICD-10-CM | POA: Diagnosis not present

## 2016-05-25 DIAGNOSIS — E039 Hypothyroidism, unspecified: Secondary | ICD-10-CM | POA: Diagnosis not present

## 2016-05-25 DIAGNOSIS — J449 Chronic obstructive pulmonary disease, unspecified: Secondary | ICD-10-CM | POA: Diagnosis not present

## 2016-05-27 DIAGNOSIS — J219 Acute bronchiolitis, unspecified: Secondary | ICD-10-CM | POA: Diagnosis not present

## 2016-05-27 DIAGNOSIS — J45909 Unspecified asthma, uncomplicated: Secondary | ICD-10-CM | POA: Diagnosis not present

## 2016-05-27 DIAGNOSIS — E039 Hypothyroidism, unspecified: Secondary | ICD-10-CM | POA: Diagnosis not present

## 2016-05-27 DIAGNOSIS — R079 Chest pain, unspecified: Secondary | ICD-10-CM | POA: Diagnosis not present

## 2016-08-05 DIAGNOSIS — E785 Hyperlipidemia, unspecified: Secondary | ICD-10-CM | POA: Diagnosis not present

## 2016-08-05 DIAGNOSIS — E039 Hypothyroidism, unspecified: Secondary | ICD-10-CM | POA: Diagnosis not present

## 2016-08-05 DIAGNOSIS — J209 Acute bronchitis, unspecified: Secondary | ICD-10-CM | POA: Diagnosis not present

## 2016-08-06 DIAGNOSIS — I10 Essential (primary) hypertension: Secondary | ICD-10-CM | POA: Diagnosis not present

## 2016-08-06 DIAGNOSIS — E784 Other hyperlipidemia: Secondary | ICD-10-CM | POA: Diagnosis not present

## 2016-08-06 DIAGNOSIS — R5381 Other malaise: Secondary | ICD-10-CM | POA: Diagnosis not present

## 2016-08-11 DIAGNOSIS — D649 Anemia, unspecified: Secondary | ICD-10-CM | POA: Diagnosis not present

## 2016-08-11 DIAGNOSIS — E785 Hyperlipidemia, unspecified: Secondary | ICD-10-CM | POA: Diagnosis not present

## 2016-08-11 DIAGNOSIS — E039 Hypothyroidism, unspecified: Secondary | ICD-10-CM | POA: Diagnosis not present

## 2016-09-07 DIAGNOSIS — I10 Essential (primary) hypertension: Secondary | ICD-10-CM | POA: Diagnosis not present

## 2016-09-07 DIAGNOSIS — E034 Atrophy of thyroid (acquired): Secondary | ICD-10-CM | POA: Diagnosis not present

## 2016-09-07 DIAGNOSIS — E784 Other hyperlipidemia: Secondary | ICD-10-CM | POA: Diagnosis not present

## 2016-09-07 DIAGNOSIS — R5381 Other malaise: Secondary | ICD-10-CM | POA: Diagnosis not present

## 2016-09-10 DIAGNOSIS — E039 Hypothyroidism, unspecified: Secondary | ICD-10-CM | POA: Diagnosis not present

## 2016-09-10 DIAGNOSIS — D649 Anemia, unspecified: Secondary | ICD-10-CM | POA: Diagnosis not present

## 2016-09-10 DIAGNOSIS — Z23 Encounter for immunization: Secondary | ICD-10-CM | POA: Diagnosis not present

## 2016-09-10 DIAGNOSIS — E785 Hyperlipidemia, unspecified: Secondary | ICD-10-CM | POA: Diagnosis not present

## 2016-09-20 DIAGNOSIS — H35342 Macular cyst, hole, or pseudohole, left eye: Secondary | ICD-10-CM | POA: Diagnosis not present

## 2016-12-14 DIAGNOSIS — E785 Hyperlipidemia, unspecified: Secondary | ICD-10-CM | POA: Diagnosis not present

## 2016-12-14 DIAGNOSIS — D649 Anemia, unspecified: Secondary | ICD-10-CM | POA: Diagnosis not present

## 2016-12-14 DIAGNOSIS — E039 Hypothyroidism, unspecified: Secondary | ICD-10-CM | POA: Diagnosis not present

## 2016-12-14 DIAGNOSIS — I10 Essential (primary) hypertension: Secondary | ICD-10-CM | POA: Diagnosis not present

## 2017-05-09 DIAGNOSIS — E034 Atrophy of thyroid (acquired): Secondary | ICD-10-CM | POA: Diagnosis not present

## 2017-05-09 DIAGNOSIS — E785 Hyperlipidemia, unspecified: Secondary | ICD-10-CM | POA: Diagnosis not present

## 2017-05-09 DIAGNOSIS — I10 Essential (primary) hypertension: Secondary | ICD-10-CM | POA: Diagnosis not present

## 2017-05-09 DIAGNOSIS — R5381 Other malaise: Secondary | ICD-10-CM | POA: Diagnosis not present

## 2017-05-09 DIAGNOSIS — E039 Hypothyroidism, unspecified: Secondary | ICD-10-CM | POA: Diagnosis not present

## 2017-05-09 DIAGNOSIS — D649 Anemia, unspecified: Secondary | ICD-10-CM | POA: Diagnosis not present

## 2017-05-09 DIAGNOSIS — E784 Other hyperlipidemia: Secondary | ICD-10-CM | POA: Diagnosis not present

## 2017-05-13 DIAGNOSIS — D649 Anemia, unspecified: Secondary | ICD-10-CM | POA: Diagnosis not present

## 2017-05-13 DIAGNOSIS — I10 Essential (primary) hypertension: Secondary | ICD-10-CM | POA: Diagnosis not present

## 2017-05-13 DIAGNOSIS — E039 Hypothyroidism, unspecified: Secondary | ICD-10-CM | POA: Diagnosis not present

## 2017-05-13 DIAGNOSIS — E785 Hyperlipidemia, unspecified: Secondary | ICD-10-CM | POA: Diagnosis not present

## 2017-05-23 DIAGNOSIS — H35342 Macular cyst, hole, or pseudohole, left eye: Secondary | ICD-10-CM | POA: Diagnosis not present

## 2017-06-11 DIAGNOSIS — R300 Dysuria: Secondary | ICD-10-CM | POA: Diagnosis not present

## 2017-06-11 DIAGNOSIS — N3090 Cystitis, unspecified without hematuria: Secondary | ICD-10-CM | POA: Diagnosis not present

## 2017-06-14 DIAGNOSIS — D649 Anemia, unspecified: Secondary | ICD-10-CM | POA: Diagnosis not present

## 2017-06-14 DIAGNOSIS — R079 Chest pain, unspecified: Secondary | ICD-10-CM | POA: Diagnosis not present

## 2017-06-14 DIAGNOSIS — E039 Hypothyroidism, unspecified: Secondary | ICD-10-CM | POA: Diagnosis not present

## 2017-06-14 DIAGNOSIS — I10 Essential (primary) hypertension: Secondary | ICD-10-CM | POA: Diagnosis not present

## 2017-06-16 ENCOUNTER — Encounter: Payer: Self-pay | Admitting: Emergency Medicine

## 2017-06-16 ENCOUNTER — Emergency Department
Admission: EM | Admit: 2017-06-16 | Discharge: 2017-06-16 | Disposition: A | Discharge status: Home / Self Care | Payer: Medicare Other | Attending: Emergency Medicine | Admitting: Emergency Medicine

## 2017-06-16 ENCOUNTER — Emergency Department: Payer: Medicare Other

## 2017-06-16 DIAGNOSIS — E039 Hypothyroidism, unspecified: Secondary | ICD-10-CM | POA: Diagnosis not present

## 2017-06-16 DIAGNOSIS — I10 Essential (primary) hypertension: Secondary | ICD-10-CM | POA: Diagnosis not present

## 2017-06-16 DIAGNOSIS — R079 Chest pain, unspecified: Secondary | ICD-10-CM | POA: Diagnosis not present

## 2017-06-16 DIAGNOSIS — R0789 Other chest pain: Secondary | ICD-10-CM | POA: Diagnosis not present

## 2017-06-16 DIAGNOSIS — Z96652 Presence of left artificial knee joint: Secondary | ICD-10-CM | POA: Insufficient documentation

## 2017-06-16 DIAGNOSIS — Z87891 Personal history of nicotine dependence: Secondary | ICD-10-CM | POA: Diagnosis not present

## 2017-06-16 LAB — CBC
HEMATOCRIT: 35.1 % (ref 35.0–47.0)
HEMOGLOBIN: 12.3 g/dL (ref 12.0–16.0)
MCH: 33.5 pg (ref 26.0–34.0)
MCHC: 35.1 g/dL (ref 32.0–36.0)
MCV: 95.4 fL (ref 80.0–100.0)
Platelets: 237 10*3/uL (ref 150–440)
RBC: 3.68 MIL/uL — ABNORMAL LOW (ref 3.80–5.20)
RDW: 13.3 % (ref 11.5–14.5)
WBC: 7.5 10*3/uL (ref 3.6–11.0)

## 2017-06-16 LAB — BASIC METABOLIC PANEL
Anion gap: 7 (ref 5–15)
BUN: 20 mg/dL (ref 6–20)
CO2: 26 mmol/L (ref 22–32)
Calcium: 10.1 mg/dL (ref 8.9–10.3)
Chloride: 103 mmol/L (ref 101–111)
Creatinine, Ser: 1.23 mg/dL — ABNORMAL HIGH (ref 0.44–1.00)
GFR calc Af Amer: 46 mL/min — ABNORMAL LOW (ref >60)
GFR, EST NON AFRICAN AMERICAN: 40 mL/min — AB (ref >60)
GLUCOSE: 107 mg/dL — AB (ref 65–99)
POTASSIUM: 3.9 mmol/L (ref 3.5–5.1)
Sodium: 136 mmol/L (ref 135–145)

## 2017-06-16 LAB — TROPONIN I: Troponin I: <0.03 ng/mL (ref <0.03)

## 2017-06-16 NOTE — Discharge Instructions (Signed)
Be sure to get the tests you have scheduled tomorrow. Please return for worse chest pain or shortness of breath in the meantime. Please follow-up with your doctor as planned.

## 2017-06-16 NOTE — ED Triage Notes (Signed)
Pt c/o central/right chest discomfort since about Tuesday this week. Reports starting abx over the weekend for UTI.  Saw PCP Tuesday she thinks and EKG done. Supposed to have echo/stress test tomorrow but started again today with chest pains.  Does have some SHOB with exertion.  Unlabored currently. NAD

## 2017-06-16 NOTE — ED Provider Notes (Signed)
[facility] Emergency Department Provider Note   ____________________________________________   First MD Initiated Contact with Patient 06/16/17 1412     (approximate)  I have reviewed the triage vital signs and the nursing notes.   HISTORY  Chief Complaint Chest Pain    HPI Gail Mora is a 83 y.o. female who reports several days of pain in the right side of her chest and her right arm. She reports shortness of breath with exertion but does not have any chest pain with exertion. She does not have any chest pain with movement of the arm she is not short of breath when she has the chest pain she does not have any unusual cough. Her baseline shortness of breath has not changed in months. She thinks maybe the pain is from arthritis. It feels something like the arthritis she's had in the past. She got a shot for in her arm recently that helped.   Past Medical History:  Diagnosis Date  . GERD (gastroesophageal reflux disease)   . Hyperlipemia   . Hypertension   . Hypothyroid     There are no active problems to display for this patient.   Past Surgical History:  Procedure Laterality Date  . ABDOMINAL HYSTERECTOMY    . Knee replacemt  2015   Left knee    Prior to Admission medications   Not on File    Allergies Patient has no known allergies.  History reviewed. No pertinent family history.  Social History Social History  Substance Use Topics  . Smoking status: Former Games developermoker  . Smokeless tobacco: Never Used  . Alcohol use No    Review of Systems  Constitutional: No fever/chills Eyes: No visual changes. ENT: No sore throat. Cardiovascular: Denies chest pain. Respiratory: Denies shortness of breath. Gastrointestinal: No abdominal pain.  No nausea, no vomiting.  No diarrhea.  No constipation. Genitourinary: Negative for dysuria. Musculoskeletal: Negative for back pain. Skin: Negative for rash. Neurological: Negative for headaches,  focal weakness  ____________________________________________   PHYSICAL EXAM:  VITAL SIGNS: ED Triage Vitals [06/16/17 1110]  Enc Vitals Group     BP (!) 164/60     Pulse Rate (!) 57     Resp 18     Temp 97.9 F (36.6 C)     Temp Source Oral     SpO2 97 %     Weight 155 lb (70.3 kg)     Height 5\' 2"  (1.575 m)     Head Circumference      Peak Flow      Pain Score 6     Pain Loc      Pain Edu?      Excl. in GC?     Constitutional: Alert and oriented. Well appearing and in no acute distress. Eyes: Conjunctivae are normal. PERRL. EOMI. Head: Atraumatic. Nose: No congestion/rhinnorhea. Mouth/Throat: Mucous membranes are moist.  Oropharynx non-erythematous. Neck: No stridor.   Cardiovascular: Normal rate, regular rhythm. Grossly normal heart sounds.  Good peripheral circulation. Respiratory: Normal respiratory effort.  No retractions. Lungs CTAB. Gastrointestinal: Soft and nontender. No distention. No abdominal bruits. No CVA tenderness. Musculoskeletal: No lower extremity tenderness nor edema.  No joint effusions. Neurologic:  Normal speech and language. No gross focal neurologic deficits are appreciated. No gait instability. Skin:  Skin is warm, dry and intact. No rash noted. Psychiatric: Mood and affect are normal. Speech and behavior are normal.  ____________________________________________   LABS (all labs ordered are listed, but only  abnormal results are displayed)  Labs Reviewed  BASIC METABOLIC PANEL - Abnormal; Notable for the following:       Result Value   Glucose, Bld 107 (*)    Creatinine, Ser 1.23 (*)    GFR calc non Af Amer 40 (*)    GFR calc Af Amer 46 (*)    All other components within normal limits  CBC - Abnormal; Notable for the following:    RBC 3.68 (*)    All other components within normal limits  TROPONIN I  TROPONIN I   ____________________________________________  EKG  EKG read and interpreted by me shows normal sinus rhythm rate of  60 normal axis normal EKG ____________________________________________  RADIOLOGY  Dg Chest 2 View  Result Date: 06/16/2017 CLINICAL DATA:  Chest pain. EXAM: CHEST  2 VIEW COMPARISON:  Radiographs of May 09, 2016 FINDINGS: The heart size and mediastinal contours are within normal limits. Atherosclerosis of thoracic aorta is noted. Both lungs are clear. No pneumothorax or pleural effusion is noted. The visualized skeletal structures are unremarkable. IMPRESSION: No active cardiopulmonary disease.  Aortic atherosclerosis. Electronically Signed   By: Lupita RaiderJames  Green Jr, M.D.   On: 06/16/2017 11:32    ____________________________________________   PROCEDURES  Procedure(s) performed:   Procedures  Critical Care performed:   ____________________________________________   INITIAL IMPRESSION / ASSESSMENT AND PLAN / ED COURSE  Pertinent labs & imaging results that were available during my care of the patient were reviewed by me and considered in my medical decision making (see chart for details).  Serial troponins are negative the rest of her lab work is negative she has cardiac testing scheduled for tomorrow and I'll think I can improve upon that. Not sure that this pain is indeed cardiac. I will discharge her and let her get her testing as scheduled.      ____________________________________________   FINAL CLINICAL IMPRESSION(S) / ED DIAGNOSES  Final diagnoses:  Nonspecific chest pain      NEW MEDICATIONS STARTED DURING THIS VISIT:  New Prescriptions   No medications on file     Note:  This document was prepared using Dragon voice recognition software and may include unintentional dictation errors.    Arnaldo NatalMalinda, Paul F, MD 06/16/17 [phone]

## 2017-06-17 DIAGNOSIS — K219 Gastro-esophageal reflux disease without esophagitis: Secondary | ICD-10-CM | POA: Diagnosis not present

## 2017-06-17 DIAGNOSIS — E039 Hypothyroidism, unspecified: Secondary | ICD-10-CM | POA: Diagnosis not present

## 2017-06-17 DIAGNOSIS — R0602 Shortness of breath: Secondary | ICD-10-CM | POA: Diagnosis not present

## 2017-06-17 DIAGNOSIS — E785 Hyperlipidemia, unspecified: Secondary | ICD-10-CM | POA: Diagnosis not present

## 2017-06-17 DIAGNOSIS — D649 Anemia, unspecified: Secondary | ICD-10-CM | POA: Diagnosis not present

## 2017-07-04 ENCOUNTER — Ambulatory Visit: Payer: Self-pay | Admitting: Urology

## 2017-07-05 ENCOUNTER — Encounter: Payer: Self-pay | Admitting: Urology

## 2017-07-05 ENCOUNTER — Ambulatory Visit (INDEPENDENT_AMBULATORY_CARE_PROVIDER_SITE_OTHER): Payer: Medicare Other | Admitting: Urology

## 2017-07-05 DIAGNOSIS — R300 Dysuria: Secondary | ICD-10-CM

## 2017-07-05 LAB — MICROSCOPIC EXAMINATION
Bacteria, UA: NONE SEEN
RBC MICROSCOPIC, UA: NONE SEEN /HPF (ref 0–?)
WBC UA: NONE SEEN /HPF (ref 0–?)

## 2017-07-05 LAB — URINALYSIS, COMPLETE
Bilirubin, UA: NEGATIVE
GLUCOSE, UA: NEGATIVE
KETONES UA: NEGATIVE
Leukocytes, UA: NEGATIVE
NITRITE UA: NEGATIVE
Protein, UA: NEGATIVE
RBC UA: NEGATIVE
SPEC GRAV UA: 1.02 (ref 1.005–1.030)
UUROB: 0.2 mg/dL (ref 0.2–1.0)
pH, UA: 5.5 (ref 5.0–7.5)

## 2017-07-05 NOTE — Progress Notes (Signed)
07/05/2017 3:47 PM   Gail Mora November 18, 1932 [id number]  Referring provider: Corky Downs, MD [address] [place], [place] [address]  Chief Complaint  Patient presents with  . Dysuria    HPI: Patient referred for urinary tract infection. She developed dysuria about 3 weeks ago. Her UA showed rare bacteria, greater than 50 white blood cells and no red blood cells. Her urine culture grew Klebsiella sensitive to cefazolin, Bactrim and Cipro. It was intermediate sensitivity to nitrofurantoin so she was switched from that to Augmentin. She took Azo. Her UA today clear. There is no bacteria nor white blood cells or red blood cells. She does not get a lot of UTI's. Maybe one per year. No kidney stones, gross hematuria or flank pain. No GU hx or surgery. She had a Hx. She wears a pad as she might leak when she sneezes or coughs. Uses 0-1 ppd. She voids with a good flow and feels empty. Dysuria improved. She hasn't had a recent breast exam and doesn't need pelvics.   PMH: Past Medical History:  Diagnosis Date  . GERD (gastroesophageal reflux disease)   . Hyperlipemia   . Hypertension   . Hypothyroid     Surgical History: Past Surgical History:  Procedure Laterality Date  . ABDOMINAL HYSTERECTOMY    . BACK SURGERY    . CATARACT EXTRACTION    . Knee replacemt  2015   Left knee  . RETINAL TEAR REPAIR CRYOTHERAPY      Home Medications:  Allergies as of 07/05/2017      Reactions   Codeine Nausea Only      Medication List       Accurate as of 07/05/17  3:47 PM. Always use your most recent med list.          allopurinol 100 MG tablet Commonly known as:  ZYLOPRIM   ALPRAZolam 0.25 MG tablet Commonly known as:  XANAX   aspirin EC 81 MG tablet Take by mouth.   levothyroxine 100 MCG tablet Commonly known as:  SYNTHROID, LEVOTHROID   lisinopril-hydrochlorothiazide 20-25 MG tablet Commonly known as:  PRINZIDE,ZESTORETIC   ranitidine 150 MG tablet Commonly known as:   ZANTAC Take by mouth.   simvastatin 40 MG tablet Commonly known as:  ZOCOR       Allergies:  Allergies  Allergen Reactions  . Codeine Nausea Only    Family History: Family History  Problem Relation Age of Onset  . Bladder Cancer Neg Hx   . Kidney cancer Neg Hx     Social History:  reports that she has quit smoking. She has never used smokeless tobacco. She reports that she does not drink alcohol or use drugs.  ROS: UROLOGY Frequent Urination?: Yes Hard to postpone urination?: No Burning/pain with urination?: Yes Get up at night to urinate?: Yes Leakage of urine?: No Urine stream starts and stops?: No Trouble starting stream?: No Do you have to strain to urinate?: No Blood in urine?: No Urinary tract infection?: Yes Sexually transmitted disease?: No Injury to kidneys or bladder?: No Painful intercourse?: No Weak stream?: No Currently pregnant?: No Vaginal bleeding?: No Last menstrual period?: n  Gastrointestinal Nausea?: No Vomiting?: No Indigestion/heartburn?: Yes Diarrhea?: No Constipation?: No  Constitutional Fever: No Night sweats?: No Weight loss?: No Fatigue?: No  Skin Skin rash/lesions?: No Itching?: No  Eyes Blurred vision?: No Double vision?: No  Ears/Nose/Throat Sore throat?: No Sinus problems?: No  Hematologic/Lymphatic Swollen glands?: No Easy bruising?: No  Cardiovascular Leg swelling?: No  Chest pain?: No  Respiratory Cough?: Yes Shortness of breath?: No  Endocrine Excessive thirst?: No  Musculoskeletal Back pain?: Yes Joint pain?: Yes  Neurological Headaches?: No Dizziness?: No  Psychologic Depression?: No Anxiety?: No  Physical Exam: There were no vitals taken for this visit.  Constitutional:  Alert and oriented, No acute distress. HEENT: [place] AT, moist mucus membranes.  Trachea midline, no masses. Cardiovascular: No clubbing, cyanosis, or edema. Respiratory: Normal respiratory effort, no increased work  of breathing. GI: Abdomen is soft, nontender, nondistended, no abdominal masses GU: No CVA tenderness. Skin: No rashes, bruises or suspicious lesions. Lymph: No cervical or inguinal adenopathy. Neurologic: Grossly intact, no focal deficits, moving all 4 extremities. Psychiatric: Normal mood and affect.  Laboratory Data: Lab Results  Component Value Date   WBC 7.5 06/16/2017   HGB 12.3 06/16/2017   HCT 35.1 06/16/2017   MCV 95.4 06/16/2017   PLT 237 06/16/2017    Lab Results  Component Value Date   CREATININE 1.23 (H) 06/16/2017    No results found for: PSA  No results found for: TESTOSTERONE  No results found for: HGBA1C  Urinalysis    Component Value Date/Time   COLORURINE STRAW (A) 05/09/2016 1538   APPEARANCEUR CLEAR (A) 05/09/2016 1538   APPEARANCEUR Clear 02/06/2014 0912   LABSPEC 1.006 05/09/2016 1538   LABSPEC 1.006 02/06/2014 0912   PHURINE 7.0 05/09/2016 1538   GLUCOSEU NEGATIVE 05/09/2016 1538   GLUCOSEU Negative 02/06/2014 0912   HGBUR NEGATIVE 05/09/2016 1538   BILIRUBINUR NEGATIVE 05/09/2016 1538   BILIRUBINUR Negative 02/06/2014 0912   KETONESUR NEGATIVE 05/09/2016 1538   PROTEINUR NEGATIVE 05/09/2016 1538   NITRITE NEGATIVE 05/09/2016 1538   LEUKOCYTESUR 1+ (A) 05/09/2016 1538   LEUKOCYTESUR Negative 02/06/2014 0912    Assessment & Plan:    1. Dysuria - related to a UTI and improved. I don't see any indication for further imaging or cystoscopy. If she has continued dysuria or vaginal irritation she could try TV estrogen but she needs routine breast exam. I'll send a message to Dr. Juel Burrow.  - Urinalysis, Complete   No Follow-up on file.  Jerilee Field, MD  [facility] [address], [address] [place], [place] [address] [phone]

## 2017-09-21 DIAGNOSIS — H35342 Macular cyst, hole, or pseudohole, left eye: Secondary | ICD-10-CM | POA: Diagnosis not present

## 2017-09-27 DIAGNOSIS — E785 Hyperlipidemia, unspecified: Secondary | ICD-10-CM | POA: Diagnosis not present

## 2017-09-27 DIAGNOSIS — J209 Acute bronchitis, unspecified: Secondary | ICD-10-CM | POA: Diagnosis not present

## 2017-09-27 DIAGNOSIS — M1990 Unspecified osteoarthritis, unspecified site: Secondary | ICD-10-CM | POA: Diagnosis not present

## 2017-09-27 DIAGNOSIS — K219 Gastro-esophageal reflux disease without esophagitis: Secondary | ICD-10-CM | POA: Diagnosis not present

## 2018-01-04 DIAGNOSIS — I10 Essential (primary) hypertension: Secondary | ICD-10-CM | POA: Diagnosis not present

## 2018-01-04 DIAGNOSIS — D649 Anemia, unspecified: Secondary | ICD-10-CM | POA: Diagnosis not present

## 2018-01-04 DIAGNOSIS — M67911 Unspecified disorder of synovium and tendon, right shoulder: Secondary | ICD-10-CM | POA: Diagnosis not present

## 2018-01-04 DIAGNOSIS — E039 Hypothyroidism, unspecified: Secondary | ICD-10-CM | POA: Diagnosis not present

## 2018-01-04 DIAGNOSIS — M1990 Unspecified osteoarthritis, unspecified site: Secondary | ICD-10-CM | POA: Diagnosis not present

## 2018-04-11 DIAGNOSIS — N3000 Acute cystitis without hematuria: Secondary | ICD-10-CM | POA: Diagnosis not present

## 2018-04-11 DIAGNOSIS — M67911 Unspecified disorder of synovium and tendon, right shoulder: Secondary | ICD-10-CM | POA: Diagnosis not present

## 2018-04-11 DIAGNOSIS — N390 Urinary tract infection, site not specified: Secondary | ICD-10-CM | POA: Diagnosis not present

## 2018-04-11 DIAGNOSIS — D649 Anemia, unspecified: Secondary | ICD-10-CM | POA: Diagnosis not present

## 2018-05-04 ENCOUNTER — Encounter: Payer: Self-pay | Admitting: Medical Oncology

## 2018-05-04 ENCOUNTER — Other Ambulatory Visit: Payer: Self-pay

## 2018-05-04 ENCOUNTER — Emergency Department: Payer: Medicare Other

## 2018-05-04 ENCOUNTER — Emergency Department
Admission: EM | Admit: 2018-05-04 | Discharge: 2018-05-04 | Disposition: A | Discharge status: Home / Self Care | Payer: Medicare Other | Attending: Emergency Medicine | Admitting: Emergency Medicine

## 2018-05-04 DIAGNOSIS — R0789 Other chest pain: Secondary | ICD-10-CM | POA: Diagnosis not present

## 2018-05-04 DIAGNOSIS — E039 Hypothyroidism, unspecified: Secondary | ICD-10-CM | POA: Diagnosis not present

## 2018-05-04 DIAGNOSIS — I10 Essential (primary) hypertension: Secondary | ICD-10-CM | POA: Diagnosis not present

## 2018-05-04 DIAGNOSIS — R0602 Shortness of breath: Secondary | ICD-10-CM | POA: Insufficient documentation

## 2018-05-04 DIAGNOSIS — Z79899 Other long term (current) drug therapy: Secondary | ICD-10-CM | POA: Insufficient documentation

## 2018-05-04 DIAGNOSIS — R079 Chest pain, unspecified: Secondary | ICD-10-CM | POA: Insufficient documentation

## 2018-05-04 DIAGNOSIS — Z87891 Personal history of nicotine dependence: Secondary | ICD-10-CM | POA: Diagnosis not present

## 2018-05-04 LAB — CBC
HCT: 35.4 % (ref 35.0–47.0)
Hemoglobin: 12.6 g/dL (ref 12.0–16.0)
MCH: 34.2 pg — ABNORMAL HIGH (ref 26.0–34.0)
MCHC: 35.5 g/dL (ref 32.0–36.0)
MCV: 96.2 fL (ref 80.0–100.0)
PLATELETS: 222 10*3/uL (ref 150–440)
RBC: 3.68 MIL/uL — AB (ref 3.80–5.20)
RDW: 13.3 % (ref 11.5–14.5)
WBC: 7.3 10*3/uL (ref 3.6–11.0)

## 2018-05-04 LAB — BASIC METABOLIC PANEL
Anion gap: 6 (ref 5–15)
BUN: 29 mg/dL — ABNORMAL HIGH (ref 6–20)
CALCIUM: 10.4 mg/dL — AB (ref 8.9–10.3)
CO2: 26 mmol/L (ref 22–32)
CREATININE: 1.38 mg/dL — AB (ref 0.44–1.00)
Chloride: 105 mmol/L (ref 101–111)
GFR, EST AFRICAN AMERICAN: 40 mL/min — AB (ref >60)
GFR, EST NON AFRICAN AMERICAN: 34 mL/min — AB (ref >60)
Glucose, Bld: 110 mg/dL — ABNORMAL HIGH (ref 65–99)
Potassium: 4.7 mmol/L (ref 3.5–5.1)
SODIUM: 137 mmol/L (ref 135–145)

## 2018-05-04 LAB — TROPONIN I: Troponin I: <0.03 ng/mL (ref <0.03)

## 2018-05-04 MED ORDER — GI COCKTAIL ~~LOC~~: 30.0000 mL | Freq: Two times a day (BID) | ORAL | 0 refills | Status: DC | PRN

## 2018-05-04 MED ORDER — GI COCKTAIL ~~LOC~~
30.0000 mL | Freq: Once | ORAL | Status: AC
  Administered 2018-05-04: 30 mL via ORAL
  Filled 2018-05-04: qty 30

## 2018-05-04 NOTE — ED Provider Notes (Signed)
[facility] Emergency Department Provider Note  Time seen: 3:04 PM  I have reviewed the triage vital signs and the nursing notes.   HISTORY  Chief Complaint Chest Pain and Shortness of Breath    HPI Gail Mora is a 84 y.o. female with a past medical history of gastric reflux, hypertension, hyperlipidemia, presents to the emergency department for chest discomfort.  According to the patient for the past 2 to 3 days she has been experiencing mild chest discomfort.  States it is not "pain" but states it feels like something is wrong.  Family states the patient has anxiety, patient states it is very nerve-racking thinking that something could be wrong with her heart.  States she normally goes to the fire department at the end of her street when she feels like something is wrong but today she said it felt different so she came to the emergency department for evaluation.  Denies any current nausea or diaphoresis.  Does state intermittent shortness of breath.   Past Medical History:  Diagnosis Date  . GERD (gastroesophageal reflux disease)   . Hyperlipemia   . Hypertension   . Hypothyroid     There are no active problems to display for this patient.   Past Surgical History:  Procedure Laterality Date  . ABDOMINAL HYSTERECTOMY    . BACK SURGERY    . CATARACT EXTRACTION    . Knee replacemt  2015   Left knee  . RETINAL TEAR REPAIR CRYOTHERAPY      Prior to Admission medications   Medication Sig Start Date End Date Taking? Authorizing Provider  allopurinol (ZYLOPRIM) 100 MG tablet  05/23/17   [provider]  ALPRAZolam Prudy Feeler(XANAX) 0.25 MG tablet  05/23/17   [provider]  aspirin EC 81 MG tablet Take by mouth.    [provider]  levothyroxine (SYNTHROID, LEVOTHROID) 100 MCG tablet  06/17/17   [provider]  lisinopril-hydrochlorothiazide (PRINZIDE,ZESTORETIC) 20-25 MG tablet  06/01/17   [provider]  ranitidine  (ZANTAC) 150 MG tablet Take by mouth.    [provider]  simvastatin (ZOCOR) 40 MG tablet  05/13/17   [provider]    Allergies  Allergen Reactions  . Codeine Nausea Only    Family History  Problem Relation Age of Onset  . Bladder Cancer Neg Hx   . Kidney cancer Neg Hx     Social History Social History   Tobacco Use  . Smoking status: Former Games developermoker  . Smokeless tobacco: Never Used  Substance Use Topics  . Alcohol use: No  . Drug use: No    Review of Systems Constitutional: Negative for fever. Eyes: Negative for visual complaints ENT: Negative for recent illness/congestion Cardiovascular: Mild chest discomfort Respiratory: Shortness of breath intermittent. Gastrointestinal: Negative for abdominal pain, vomiting  Genitourinary: Negative for urinary compaints Musculoskeletal: Negative for leg pain or swelling Skin: Negative for skin complaints  Neurological: Negative for headache All other ROS negative  ____________________________________________   PHYSICAL EXAM:  VITAL SIGNS: ED Triage Vitals [05/04/18 1308]  Enc Vitals Group     BP (!) 161/51     Pulse Rate 68     Resp 20     Temp 98.2 F (36.8 C)     Temp Source Oral     SpO2 98 %     Weight 150 lb (68 kg)     Height 5\' 2"  (1.575 m)     Head Circumference  Peak Flow      Pain Score 2     Pain Loc      Pain Edu?      Excl. in GC?     Constitutional: Alert and oriented. Well appearing and in no distress. Eyes: Normal exam ENT   Head: Normocephalic and atraumatic.   Mouth/Throat: Mucous membranes are moist. Cardiovascular: Normal rate, regular rhythm. No murmur Respiratory: Normal respiratory effort without tachypnea nor retractions. Breath sounds are clear  Gastrointestinal: Soft and nontender. No distention. Musculoskeletal: Nontender with normal range of motion in all extremities. No lower extremity tenderness or edema. Neurologic:  Normal speech and language. No  gross focal neurologic deficits Skin:  Skin is warm, dry and intact.  Psychiatric: Mood and affect are normal.   ____________________________________________    EKG  EKG reviewed and interpreted by myself shows what appears to be a sinus rhythm at 64 bpm, narrow QRS, normal axis, largely normal intervals with no concerning ST changes.  ____________________________________________    RADIOLOGY  Chest x-ray normal  ____________________________________________   INITIAL IMPRESSION / ASSESSMENT AND PLAN / ED COURSE  Pertinent labs & imaging results that were available during my care of the patient were reviewed by me and considered in my medical decision making (see chart for details).  Patient presents to the emergency department for chest discomfort ongoing for the past 2 to 3 days.  Differential would include chest wall pain, ACS, reflux, anxiety.  Patient states she has anxiety, also states she gets bad reflux and stopped taking her medication several months ago.  States very minimal discomfort currently.  Overall the patient appears very well.  EKG is reassuring, labs including cardiac enzymes are normal.  Chest x-ray is normal.  We will repeat a troponin continue to closely monitor.  Anticipate likely discharge home with cardiology follow-up.  Patient states she recently had an echocardiogram and stress test 1 to 2 years ago which was normal per patient.  Patient's repeat troponin is negative.  We will have the patient follow-up with Dr. Juliann Pares.  I discussed my typical chest pain return precautions.  Patient and family agreeable to plan of care.    ____________________________________________   FINAL CLINICAL IMPRESSION(S) / ED DIAGNOSES  Chest pain    Minna Antis, MD 05/04/18 1630

## 2018-05-04 NOTE — ED Triage Notes (Signed)
Pt reports generalized chest pain that began 2 days ago with sob. Pt appears anxious in triage.

## 2018-06-05 DIAGNOSIS — R079 Chest pain, unspecified: Secondary | ICD-10-CM | POA: Diagnosis not present

## 2018-06-05 DIAGNOSIS — E7849 Other hyperlipidemia: Secondary | ICD-10-CM | POA: Diagnosis not present

## 2018-06-05 DIAGNOSIS — E119 Type 2 diabetes mellitus without complications: Secondary | ICD-10-CM | POA: Diagnosis not present

## 2018-06-05 DIAGNOSIS — I10 Essential (primary) hypertension: Secondary | ICD-10-CM | POA: Diagnosis not present

## 2018-06-05 DIAGNOSIS — E034 Atrophy of thyroid (acquired): Secondary | ICD-10-CM | POA: Diagnosis not present

## 2018-08-11 DIAGNOSIS — H35342 Macular cyst, hole, or pseudohole, left eye: Secondary | ICD-10-CM | POA: Diagnosis not present

## 2018-08-18 DIAGNOSIS — D649 Anemia, unspecified: Secondary | ICD-10-CM | POA: Diagnosis not present

## 2018-08-18 DIAGNOSIS — E039 Hypothyroidism, unspecified: Secondary | ICD-10-CM | POA: Diagnosis not present

## 2018-08-18 DIAGNOSIS — K219 Gastro-esophageal reflux disease without esophagitis: Secondary | ICD-10-CM | POA: Diagnosis not present

## 2018-08-18 DIAGNOSIS — Z0000 Encounter for general adult medical examination without abnormal findings: Secondary | ICD-10-CM | POA: Diagnosis not present

## 2018-08-18 DIAGNOSIS — Z23 Encounter for immunization: Secondary | ICD-10-CM | POA: Diagnosis not present

## 2018-08-18 DIAGNOSIS — E785 Hyperlipidemia, unspecified: Secondary | ICD-10-CM | POA: Diagnosis not present

## 2018-11-21 DIAGNOSIS — E039 Hypothyroidism, unspecified: Secondary | ICD-10-CM | POA: Diagnosis not present

## 2018-11-21 DIAGNOSIS — E785 Hyperlipidemia, unspecified: Secondary | ICD-10-CM | POA: Diagnosis not present

## 2018-11-21 DIAGNOSIS — K219 Gastro-esophageal reflux disease without esophagitis: Secondary | ICD-10-CM | POA: Diagnosis not present

## 2018-11-21 DIAGNOSIS — D649 Anemia, unspecified: Secondary | ICD-10-CM | POA: Diagnosis not present

## 2018-12-19 DIAGNOSIS — K219 Gastro-esophageal reflux disease without esophagitis: Secondary | ICD-10-CM | POA: Diagnosis not present

## 2018-12-19 DIAGNOSIS — D649 Anemia, unspecified: Secondary | ICD-10-CM | POA: Diagnosis not present

## 2018-12-19 DIAGNOSIS — I10 Essential (primary) hypertension: Secondary | ICD-10-CM | POA: Diagnosis not present

## 2018-12-19 DIAGNOSIS — E039 Hypothyroidism, unspecified: Secondary | ICD-10-CM | POA: Diagnosis not present

## 2019-03-05 DIAGNOSIS — E785 Hyperlipidemia, unspecified: Secondary | ICD-10-CM | POA: Diagnosis not present

## 2019-03-05 DIAGNOSIS — E039 Hypothyroidism, unspecified: Secondary | ICD-10-CM | POA: Diagnosis not present

## 2019-03-05 DIAGNOSIS — D649 Anemia, unspecified: Secondary | ICD-10-CM | POA: Diagnosis not present

## 2019-03-05 DIAGNOSIS — K219 Gastro-esophageal reflux disease without esophagitis: Secondary | ICD-10-CM | POA: Diagnosis not present

## 2019-03-05 DIAGNOSIS — M1711 Unilateral primary osteoarthritis, right knee: Secondary | ICD-10-CM | POA: Diagnosis not present

## 2019-04-03 DIAGNOSIS — D649 Anemia, unspecified: Secondary | ICD-10-CM | POA: Diagnosis not present

## 2019-04-03 DIAGNOSIS — K219 Gastro-esophageal reflux disease without esophagitis: Secondary | ICD-10-CM | POA: Diagnosis not present

## 2019-04-03 DIAGNOSIS — I10 Essential (primary) hypertension: Secondary | ICD-10-CM | POA: Diagnosis not present

## 2019-04-03 DIAGNOSIS — E039 Hypothyroidism, unspecified: Secondary | ICD-10-CM | POA: Diagnosis not present

## 2019-04-04 DIAGNOSIS — R5381 Other malaise: Secondary | ICD-10-CM | POA: Diagnosis not present

## 2019-04-04 DIAGNOSIS — R079 Chest pain, unspecified: Secondary | ICD-10-CM | POA: Diagnosis not present

## 2019-04-04 DIAGNOSIS — E034 Atrophy of thyroid (acquired): Secondary | ICD-10-CM | POA: Diagnosis not present

## 2019-04-04 DIAGNOSIS — E7849 Other hyperlipidemia: Secondary | ICD-10-CM | POA: Diagnosis not present

## 2019-04-04 DIAGNOSIS — I10 Essential (primary) hypertension: Secondary | ICD-10-CM | POA: Diagnosis not present

## 2019-04-11 DIAGNOSIS — E785 Hyperlipidemia, unspecified: Secondary | ICD-10-CM | POA: Diagnosis not present

## 2019-04-11 DIAGNOSIS — K219 Gastro-esophageal reflux disease without esophagitis: Secondary | ICD-10-CM | POA: Diagnosis not present

## 2019-04-11 DIAGNOSIS — D649 Anemia, unspecified: Secondary | ICD-10-CM | POA: Diagnosis not present

## 2019-04-11 DIAGNOSIS — E039 Hypothyroidism, unspecified: Secondary | ICD-10-CM | POA: Diagnosis not present

## 2019-05-24 DIAGNOSIS — H903 Sensorineural hearing loss, bilateral: Secondary | ICD-10-CM | POA: Diagnosis not present

## 2019-05-24 DIAGNOSIS — H6122 Impacted cerumen, left ear: Secondary | ICD-10-CM | POA: Diagnosis not present

## 2019-05-30 DIAGNOSIS — E039 Hypothyroidism, unspecified: Secondary | ICD-10-CM | POA: Diagnosis not present

## 2019-05-30 DIAGNOSIS — I10 Essential (primary) hypertension: Secondary | ICD-10-CM | POA: Diagnosis not present

## 2019-05-30 DIAGNOSIS — D649 Anemia, unspecified: Secondary | ICD-10-CM | POA: Diagnosis not present

## 2019-05-30 DIAGNOSIS — K219 Gastro-esophageal reflux disease without esophagitis: Secondary | ICD-10-CM | POA: Diagnosis not present

## 2019-06-28 ENCOUNTER — Other Ambulatory Visit: Payer: Self-pay

## 2019-06-28 DIAGNOSIS — H35342 Macular cyst, hole, or pseudohole, left eye: Secondary | ICD-10-CM | POA: Diagnosis not present

## 2019-06-28 NOTE — Patient Outreach (Signed)
[facility]) Care Management  06/28/2019  Gail Mora 11/20/1932 [id number]   Medication Adherence call to Gail Mora HIPPA Compliant Voice message left with a call back number. Gail Mora is showing past due on Lisinopril/Hctz 20/25 mg under [place].   [place] Management Direct Dial [phone]  Fax [phone] [email or web address]

## 2019-07-12 DIAGNOSIS — D649 Anemia, unspecified: Secondary | ICD-10-CM | POA: Diagnosis not present

## 2019-07-12 DIAGNOSIS — E039 Hypothyroidism, unspecified: Secondary | ICD-10-CM | POA: Diagnosis not present

## 2019-07-12 DIAGNOSIS — E785 Hyperlipidemia, unspecified: Secondary | ICD-10-CM | POA: Diagnosis not present

## 2019-07-12 DIAGNOSIS — K219 Gastro-esophageal reflux disease without esophagitis: Secondary | ICD-10-CM | POA: Diagnosis not present

## 2019-07-12 DIAGNOSIS — Z23 Encounter for immunization: Secondary | ICD-10-CM | POA: Diagnosis not present

## 2019-08-03 ENCOUNTER — Other Ambulatory Visit: Payer: Self-pay

## 2019-08-03 NOTE — Patient Outreach (Signed)
[facility]) Care Management  08/03/2019  [name] [birth year] [id number]   Medication Adherence call to Mrs. Gail Mora HIPPA Compliant Voice message left with a call back number. Gail Mora is showing past due on Simvastatin 40 mg under [place].   [place] Management Direct Dial [phone]  Fax [phone] [name]

## 2019-08-22 ENCOUNTER — Other Ambulatory Visit: Payer: Self-pay

## 2019-08-22 NOTE — Patient Outreach (Signed)
[facility] [facility]) Care Management  08/22/2019  LATRISHA COIRO 11/26/1932 [id number]   Medication Adherence call to Mrs. Georga Hacking HIPPA Compliant Voice message left with a call back number. Mrs. Hauck is showing past due on Simvastatin 40 mg under [place].   [place] Management Direct Dial [phone]  Fax [phone] [email or web address]

## 2019-11-21 DIAGNOSIS — M1990 Unspecified osteoarthritis, unspecified site: Secondary | ICD-10-CM | POA: Diagnosis not present

## 2019-11-21 DIAGNOSIS — I10 Essential (primary) hypertension: Secondary | ICD-10-CM | POA: Diagnosis not present

## 2019-11-21 DIAGNOSIS — D649 Anemia, unspecified: Secondary | ICD-10-CM | POA: Diagnosis not present

## 2019-11-21 DIAGNOSIS — E039 Hypothyroidism, unspecified: Secondary | ICD-10-CM | POA: Diagnosis not present

## 2020-03-28 ENCOUNTER — Other Ambulatory Visit: Payer: Self-pay

## 2020-03-28 ENCOUNTER — Ambulatory Visit (INDEPENDENT_AMBULATORY_CARE_PROVIDER_SITE_OTHER): Payer: Medicare Other | Admitting: Internal Medicine

## 2020-03-28 ENCOUNTER — Encounter: Payer: Self-pay | Admitting: Internal Medicine

## 2020-03-28 VITALS — BP 164/80 | HR 82 | Wt 149.5 lb

## 2020-03-28 DIAGNOSIS — F419 Anxiety disorder, unspecified: Secondary | ICD-10-CM | POA: Insufficient documentation

## 2020-03-28 MED ORDER — ALPRAZOLAM 0.25 MG PO TABS: 0.2500 mg | ORAL_TABLET | Freq: Two times a day (BID) | ORAL | 0 refills | Status: AC | PRN

## 2020-03-28 NOTE — Assessment & Plan Note (Signed)
Patient was advised to stay in the present moment mindful exercises were discussed

## 2020-03-28 NOTE — Progress Notes (Signed)
Established Patient Office Visit  Subjective:  Patient ID: Gail Mora, female    DOB: 1933/04/03  Age: 86 y.o. MRN: [id number]  CC:  Chief Complaint  Patient presents with  . Medication Refill    patient needs refill on xanax     c/o anxiety  , she is not depressed.  Denies any history of palpitation or hyperventilation.  Uses her medication  As needed  Past Medical History:  Diagnosis Date  . GERD (gastroesophageal reflux disease)   . Hyperlipemia   . Hypertension   . Hypothyroid     Past Surgical History:  Procedure Laterality Date  . ABDOMINAL HYSTERECTOMY    . BACK SURGERY    . CATARACT EXTRACTION    . Knee replacemt  2015   Left knee  . RETINAL TEAR REPAIR CRYOTHERAPY      Family History  Problem Relation Age of Onset  . Bladder Cancer Neg Hx   . Kidney cancer Neg Hx     Social History   Socioeconomic History  . Marital status: Married    Spouse name: Not on file  . Number of children: Not on file  . Years of education: Not on file  . Highest education level: Not on file  Occupational History  . Not on file  Tobacco Use  . Smoking status: Former Games developer  . Smokeless tobacco: Never Used  Substance and Sexual Activity  . Alcohol use: No  . Drug use: No  . Sexual activity: Not on file  Other Topics Concern  . Not on file  Social History Narrative  . Not on file   Social Determinants of Health   Financial Resource Strain:   . Difficulty of Paying Living Expenses:   Food Insecurity:   . Worried About Programme researcher, broadcasting/film/video in the Last Year:   . Barista in the Last Year:   Transportation Needs:   . Freight forwarder (Medical):   Marland Kitchen Lack of Transportation (Non-Medical):   Physical Activity:   . Days of Exercise per Week:   . Minutes of Exercise per Session:   Stress:   . Feeling of Stress :   Social Connections:   . Frequency of Communication with Friends and Family:   . Frequency of Social Gatherings with Friends and Family:   .  Attends Religious Services:   . Active Member of Clubs or Organizations:   . Attends Banker Meetings:   Marland Kitchen Marital Status:   Intimate Partner Violence:   . Fear of Current or Ex-Partner:   . Emotionally Abused:   Marland Kitchen Physically Abused:   . Sexually Abused:      Current Outpatient Medications:  .  allopurinol (ZYLOPRIM) 100 MG tablet, Take 100 mg by mouth daily. , Disp: , Rfl:  .  ALPRAZolam (XANAX) 0.25 MG tablet, Take 1 tablet (0.25 mg total) by mouth 2 (two) times daily as needed for anxiety., Disp: 60 tablet, Rfl: 0 .  Alum & Mag Hydroxide-Simeth (GI COCKTAIL) SUSP suspension, Take 30 mLs by mouth 2 (two) times daily as needed for indigestion (abd pain). Shake well. Each dose to containe 19mL maalox and 107mL viscous lidocaine and 81mL donnatal, Disp: 300 mL, Rfl: 0 .  aspirin EC 81 MG tablet, Take 81 mg by mouth daily. , Disp: , Rfl:  .  BREO ELLIPTA 200-25 MCG/INH AEPB, Inhale 1 puff into the lungs daily., Disp: , Rfl: 6 .  levothyroxine (SYNTHROID, LEVOTHROID) 100 MCG tablet,  Take 100 mcg by mouth daily before breakfast. , Disp: , Rfl:  .  lisinopril-hydrochlorothiazide (PRINZIDE,ZESTORETIC) 20-25 MG tablet, Take 1 tablet by mouth daily. , Disp: , Rfl:  .  simvastatin (ZOCOR) 40 MG tablet, Take 40 mg by mouth daily at 6 PM. , Disp: , Rfl:    Allergies  Allergen Reactions  . Codeine Nausea Only    ROS Review of Systems  Constitutional: Negative for appetite change and chills.  HENT: Negative.   Eyes: Negative.   Respiratory: Negative.   Cardiovascular: Negative.   Gastrointestinal: Negative.   Endocrine: Negative.   Genitourinary: Negative.   Musculoskeletal: Negative.   Neurological: Negative.   Psychiatric/Behavioral: Negative for agitation, behavioral problems, confusion, dysphoric mood and hallucinations.      Objective:    Physical Exam  Constitutional: She is oriented to person, place, and time. She appears well-developed and well-nourished.  HENT:    Head: Normocephalic and atraumatic.  Eyes: Pupils are equal, round, and reactive to light.  Neck: No JVD present. No thyromegaly present.  Cardiovascular:  Murmur heard. Pulmonary/Chest: Effort normal and breath sounds normal. She has no wheezes.  Musculoskeletal:        General: No edema.     Cervical back: Normal range of motion and neck supple.  Neurological: She is oriented to person, place, and time.    BP (!) 164/80 (BP Location: Left Arm, Cuff Size: Large)   Pulse 82   Wt 149 lb 8 oz (67.8 kg)   BMI 27.34 kg/m  Wt Readings from Last 3 Encounters:  03/28/20 149 lb 8 oz (67.8 kg)  05/04/18 150 lb (68 kg)  06/16/17 155 lb (70.3 kg)     Health Maintenance Due  Topic Date Due  . COVID-19 Vaccine (1) Never done  . TETANUS/TDAP  Never done  . DEXA SCAN  Never done  . PNA vac Low Risk Adult (1 of 2 - PCV13) Never done    There are no preventive care reminders to display for this patient.  No results found for: TSH Lab Results  Component Value Date   WBC 7.3 05/04/2018   HGB 12.6 05/04/2018   HCT 35.4 05/04/2018   MCV 96.2 05/04/2018   PLT 222 05/04/2018   Lab Results  Component Value Date   NA 137 05/04/2018   K 4.7 05/04/2018   CO2 26 05/04/2018   GLUCOSE 110 (H) 05/04/2018   BUN 29 (H) 05/04/2018   CREATININE 1.38 (H) 05/04/2018   BILITOT 0.7 11/30/2015   ALKPHOS 44 11/30/2015   AST 18 11/30/2015   ALT 11 (L) 11/30/2015   PROT 7.6 11/30/2015   ALBUMIN 4.1 11/30/2015   CALCIUM 10.4 (H) 05/04/2018   ANIONGAP 6 05/04/2018   No results found for: CHOL No results found for: HDL No results found for: LDLCALC No results found for: TRIG No results found for: CHOLHDL No results found for: HGBA1C    Assessment & Plan:   Problem List Items Addressed This Visit      Other   Anxiety disorder - Primary    Patient was advised to stay in the present moment mindful exercises were discussed      Relevant Medications   ALPRAZolam (XANAX) 0.25 MG tablet       Meds ordered this encounter  Medications  . ALPRAZolam (XANAX) 0.25 MG tablet    Sig: Take 1 tablet (0.25 mg total) by mouth 2 (two) times daily as needed for anxiety.    Dispense:  60  tablet    Refill:  0    Follow-up: Return in about 3 months (around 06/28/2020).    Corky Downs, MD

## 2020-04-03 ENCOUNTER — Other Ambulatory Visit: Payer: Self-pay

## 2020-04-03 ENCOUNTER — Encounter: Payer: Medicare Other | Admitting: Internal Medicine

## 2020-04-03 ENCOUNTER — Ambulatory Visit (INDEPENDENT_AMBULATORY_CARE_PROVIDER_SITE_OTHER): Payer: Medicare Other | Admitting: *Deleted

## 2020-04-03 DIAGNOSIS — F419 Anxiety disorder, unspecified: Secondary | ICD-10-CM | POA: Diagnosis not present

## 2020-04-03 DIAGNOSIS — I10 Essential (primary) hypertension: Secondary | ICD-10-CM

## 2020-04-03 DIAGNOSIS — E785 Hyperlipidemia, unspecified: Secondary | ICD-10-CM | POA: Diagnosis not present

## 2020-04-04 LAB — COMPLETE METABOLIC PANEL WITH GFR
AG Ratio: 1.8 (calc) (ref 1.0–2.5)
ALT: 10 U/L (ref 6–29)
AST: 18 U/L (ref 10–35)
Albumin: 4.4 g/dL (ref 3.6–5.1)
Alkaline phosphatase (APISO): 58 U/L (ref 37–153)
BUN/Creatinine Ratio: 16 (calc) (ref 6–22)
BUN: 21 mg/dL (ref 7–25)
CO2: 24 mmol/L (ref 20–32)
Calcium: 10.4 mg/dL (ref 8.6–10.4)
Chloride: 100 mmol/L (ref 98–110)
Creat: 1.32 mg/dL — ABNORMAL HIGH (ref 0.60–0.88)
GFR, Est African American: 43 mL/min/{1.73_m2} — ABNORMAL LOW (ref >=60)
GFR, Est Non African American: 37 mL/min/{1.73_m2} — ABNORMAL LOW (ref >=60)
Globulin: 2.5 g/dL (calc) (ref 1.9–3.7)
Glucose, Bld: 88 mg/dL (ref 65–99)
Potassium: 4.8 mmol/L (ref 3.5–5.3)
Sodium: 136 mmol/L (ref 135–146)
Total Bilirubin: 0.7 mg/dL (ref 0.2–1.2)
Total Protein: 6.9 g/dL (ref 6.1–8.1)

## 2020-04-04 LAB — EXTRA LAV TOP TUBE

## 2020-04-09 ENCOUNTER — Other Ambulatory Visit: Payer: Self-pay

## 2020-04-09 ENCOUNTER — Ambulatory Visit (INDEPENDENT_AMBULATORY_CARE_PROVIDER_SITE_OTHER): Payer: Medicare Other | Admitting: Internal Medicine

## 2020-04-09 DIAGNOSIS — I10 Essential (primary) hypertension: Secondary | ICD-10-CM | POA: Diagnosis not present

## 2020-04-09 DIAGNOSIS — M25511 Pain in right shoulder: Secondary | ICD-10-CM | POA: Diagnosis not present

## 2020-04-09 DIAGNOSIS — F419 Anxiety disorder, unspecified: Secondary | ICD-10-CM | POA: Diagnosis not present

## 2020-04-09 DIAGNOSIS — Z0000 Encounter for general adult medical examination without abnormal findings: Secondary | ICD-10-CM

## 2020-04-11 ENCOUNTER — Encounter: Payer: Self-pay | Admitting: Internal Medicine

## 2020-04-11 ENCOUNTER — Other Ambulatory Visit: Payer: Self-pay

## 2020-04-11 ENCOUNTER — Ambulatory Visit (INDEPENDENT_AMBULATORY_CARE_PROVIDER_SITE_OTHER): Payer: Medicare Other | Admitting: Internal Medicine

## 2020-04-11 VITALS — BP 175/72 | HR 64

## 2020-04-11 DIAGNOSIS — M25511 Pain in right shoulder: Secondary | ICD-10-CM | POA: Diagnosis not present

## 2020-04-11 DIAGNOSIS — F419 Anxiety disorder, unspecified: Secondary | ICD-10-CM | POA: Diagnosis not present

## 2020-04-11 DIAGNOSIS — I10 Essential (primary) hypertension: Secondary | ICD-10-CM | POA: Diagnosis not present

## 2020-04-11 MED ORDER — GABAPENTIN 100 MG PO CAPS: 100.0000 mg | ORAL_CAPSULE | Freq: Three times a day (TID) | ORAL | 3 refills | Status: DC

## 2020-04-11 NOTE — Assessment & Plan Note (Signed)
Chronic problem. 

## 2020-04-11 NOTE — Addendum Note (Signed)
Addended by: Corky Downs on: 04/11/2020 06:30 PM   Modules accepted: Level of Service

## 2020-04-11 NOTE — Progress Notes (Signed)
Joint Injection/Arthrocentesis  Date/Time: 04/11/2020 11:36 AM Performed by: Corky Downs, MD Authorized by: Corky Downs, MD  Indications: pain and joint swelling  Body area: shoulder Joint: right shoulder Local anesthesia used: yes  Anesthesia: Local anesthesia used: yes Local Anesthetic: lidocaine 1% without epinephrine  Sedation: Patient sedated: no  Needle size: 22 G Comments: Left shoulder was prepared with alcohol and under sterile condition.  Left shoulder was injected with 1 cc of lidocaine without epinephrine.  Next the left shoulder was injected in the posterior aspect with 40 mg of Kenalog mixed with 1 cc of Marcaine.  Patient tolerated the procedure well and she was watched in the office for 5 minutes after the procedure to make sure she does not have any allergic reaction.   Amendment.  Patient was injected in the right shoulder rather than the left.

## 2020-04-11 NOTE — Assessment & Plan Note (Signed)
Blood pressure stable ? ?

## 2020-04-11 NOTE — Progress Notes (Signed)
Established Patient Office Visit  Subjective:  Patient ID: Gail Mora, female    DOB: 10-13-1933  Age: 86 y.o. MRN: [id number]  CC:  Chief Complaint  Patient presents with  . Shoulder Pain    patient is here for 2 day follow up     HPI  Gail Mora  came for shoulder injection.  Past Medical History:  Diagnosis Date  . GERD (gastroesophageal reflux disease)   . Hyperlipemia   . Hypertension   . Hypothyroid     Past Surgical History:  Procedure Laterality Date  . ABDOMINAL HYSTERECTOMY    . BACK SURGERY    . CATARACT EXTRACTION    . Knee replacemt  2015   Left knee  . RETINAL TEAR REPAIR CRYOTHERAPY      Family History  Problem Relation Age of Onset  . Bladder Cancer Neg Hx   . Kidney cancer Neg Hx     Social History   Socioeconomic History  . Marital status: Married    Spouse name: Not on file  . Number of children: Not on file  . Years of education: Not on file  . Highest education level: Not on file  Occupational History  . Not on file  Tobacco Use  . Smoking status: Former Research scientist (life sciences)  . Smokeless tobacco: Never Used  Substance and Sexual Activity  . Alcohol use: No  . Drug use: No  . Sexual activity: Not on file  Other Topics Concern  . Not on file  Social History Narrative  . Not on file   Social Determinants of Health   Financial Resource Strain:   . Difficulty of Paying Living Expenses:   Food Insecurity:   . Worried About Charity fundraiser in the Last Year:   . Arboriculturist in the Last Year:   Transportation Needs:   . Film/video editor (Medical):   Marland Kitchen Lack of Transportation (Non-Medical):   Physical Activity:   . Days of Exercise per Week:   . Minutes of Exercise per Session:   Stress:   . Feeling of Stress :   Social Connections:   . Frequency of Communication with Friends and Family:   . Frequency of Social Gatherings with Friends and Family:   . Attends Religious Services:   . Active Member of Clubs or  Organizations:   . Attends Archivist Meetings:   Marland Kitchen Marital Status:   Intimate Partner Violence:   . Fear of Current or Ex-Partner:   . Emotionally Abused:   Marland Kitchen Physically Abused:   . Sexually Abused:      Current Outpatient Medications:  .  allopurinol (ZYLOPRIM) 100 MG tablet, Take 100 mg by mouth daily. , Disp: , Rfl:  .  ALPRAZolam (XANAX) 0.25 MG tablet, Take 1 tablet (0.25 mg total) by mouth 2 (two) times daily as needed for anxiety., Disp: 60 tablet, Rfl: 0 .  Alum & Mag Hydroxide-Simeth (GI COCKTAIL) SUSP suspension, Take 30 mLs by mouth 2 (two) times daily as needed for indigestion (abd pain). Shake well. Each dose to containe 108mL maalox and 62mL viscous lidocaine and 7mL donnatal, Disp: 300 mL, Rfl: 0 .  aspirin EC 81 MG tablet, Take 81 mg by mouth daily. , Disp: , Rfl:  .  BREO ELLIPTA 200-25 MCG/INH AEPB, Inhale 1 puff into the lungs daily., Disp: , Rfl: 6 .  levothyroxine (SYNTHROID, LEVOTHROID) 100 MCG tablet, Take 100 mcg by mouth daily before breakfast. , Disp: ,  Rfl:  .  lisinopril-hydrochlorothiazide (PRINZIDE,ZESTORETIC) 20-25 MG tablet, Take 1 tablet by mouth daily. , Disp: , Rfl:  .  simvastatin (ZOCOR) 40 MG tablet, Take 40 mg by mouth daily at 6 PM. , Disp: , Rfl:    Allergies  Allergen Reactions  . Codeine Nausea Only    ROS Review of Systems review of systems negative.    Objective:    Physical Exam physical examination of the rt shoulder revealed patient has trouble raising the left arm above the shoulder and there is also weakness of the rotator cuff muscles.  There is no swelling or inflammation of the right shoulder joint.  She denies any pain in the movement of the cervical spine.  Chest is clear heart is regular abdomen soft.  BP (!) 175/72   Pulse 64  Wt Readings from Last 3 Encounters:  03/28/20 149 lb 8 oz (67.8 kg)  05/04/18 150 lb (68 kg)  06/16/17 155 lb (70.3 kg)     Health Maintenance Due  Topic Date Due  . COVID-19 Vaccine  (1) Never done  . TETANUS/TDAP  Never done  . DEXA SCAN  Never done  . PNA vac Low Risk Adult (1 of 2 - PCV13) Never done    There are no preventive care reminders to display for this patient.  No results found for: TSH Lab Results  Component Value Date   WBC 7.3 05/04/2018   HGB 12.6 05/04/2018   HCT 35.4 05/04/2018   MCV 96.2 05/04/2018   PLT 222 05/04/2018   Lab Results  Component Value Date   NA 136 04/03/2020   K 4.8 04/03/2020   CO2 24 04/03/2020   GLUCOSE 88 04/03/2020   BUN 21 04/03/2020   CREATININE 1.32 (H) 04/03/2020   BILITOT 0.7 04/03/2020   ALKPHOS 44 11/30/2015   AST 18 04/03/2020   ALT 10 04/03/2020   PROT 6.9 04/03/2020   ALBUMIN 4.1 11/30/2015   CALCIUM 10.4 04/03/2020   ANIONGAP 6 05/04/2018   No results found for: CHOL No results found for: HDL No results found for: LDLCALC No results found for: TRIG No results found for: CHOLHDL No results found for: OZDG6Y    Assessment & Plan:   Problem List Items Addressed This Visit    None    Visit Diagnoses    Right shoulder pain, unspecified chronicity    -  Primary   Relevant Orders   Joint Injection/Arthrocentesis      No orders of the defined types were placed in this encounter.   Follow-up: No follow-ups on file.    Corky Downs, MD

## 2020-04-11 NOTE — Assessment & Plan Note (Signed)
Right shoulder is injected with Kenalog.

## 2020-04-17 ENCOUNTER — Other Ambulatory Visit: Payer: Self-pay | Admitting: Internal Medicine

## 2020-05-06 DIAGNOSIS — Z0000 Encounter for general adult medical examination without abnormal findings: Secondary | ICD-10-CM | POA: Insufficient documentation

## 2020-05-06 NOTE — Assessment & Plan Note (Signed)
Patient has a normal physical exam.  He has a problem with neuropathy gabapentin  BP.   marginally elevated.  Patient complaining of right shoulder pain which is due to rotator cuff syndrome.

## 2020-05-06 NOTE — Assessment & Plan Note (Signed)
Right shoulder pain is most likely due to rotator cuff syndrome.  She was advised to see an orthopedic surgeon.   I will give her a steroid shot. If she wanted

## 2020-05-06 NOTE — Assessment & Plan Note (Signed)
-   Patient experiencing high levels of anxiety.  - Encouraged patient to engage in relaxing activities like yoga, meditation, journaling, going for a walk, or participating in a hobby.  - Encouraged patient to reach out to trusted friends or family members about recent struggles 

## 2020-05-06 NOTE — Progress Notes (Signed)
Established Patient Office Visit  Subjective:  Patient ID: Gail Mora, female    DOB: 11/29/1932  Age: 86 y.o. MRN: [id number]  CC:  Chief Complaint  Patient presents with  . Annual Exam    HPI  Gail Mora presents for physical  Past Medical History:  Diagnosis Date  . GERD (gastroesophageal reflux disease)   . Hyperlipemia   . Hypertension   . Hypothyroid     Past Surgical History:  Procedure Laterality Date  . ABDOMINAL HYSTERECTOMY    . BACK SURGERY    . CATARACT EXTRACTION    . Knee replacemt  2015   Left knee  . RETINAL TEAR REPAIR CRYOTHERAPY      Family History  Problem Relation Age of Onset  . Bladder Cancer Neg Hx   . Kidney cancer Neg Hx     Social History   Socioeconomic History  . Marital status: Married    Spouse name: Not on file  . Number of children: Not on file  . Years of education: Not on file  . Highest education level: Not on file  Occupational History  . Not on file  Tobacco Use  . Smoking status: Former Games developer  . Smokeless tobacco: Never Used  Substance and Sexual Activity  . Alcohol use: No  . Drug use: No  . Sexual activity: Not on file  Other Topics Concern  . Not on file  Social History Narrative  . Not on file   Social Determinants of Health   Financial Resource Strain:   . Difficulty of Paying Living Expenses:   Food Insecurity:   . Worried About Programme researcher, broadcasting/film/video in the Last Year:   . Barista in the Last Year:   Transportation Needs:   . Freight forwarder (Medical):   Marland Kitchen Lack of Transportation (Non-Medical):   Physical Activity:   . Days of Exercise per Week:   . Minutes of Exercise per Session:   Stress:   . Feeling of Stress :   Social Connections:   . Frequency of Communication with Friends and Family:   . Frequency of Social Gatherings with Friends and Family:   . Attends Religious Services:   . Active Member of Clubs or Organizations:   . Attends Banker Meetings:   Marland Kitchen  Marital Status:   Intimate Partner Violence:   . Fear of Current or Ex-Partner:   . Emotionally Abused:   Marland Kitchen Physically Abused:   . Sexually Abused:      Current Outpatient Medications:  .  allopurinol (ZYLOPRIM) 100 MG tablet, Take 100 mg by mouth daily. , Disp: , Rfl:  .  Alum & Mag Hydroxide-Simeth (GI COCKTAIL) SUSP suspension, Take 30 mLs by mouth 2 (two) times daily as needed for indigestion (abd pain). Shake well. Each dose to containe 27mL maalox and 39mL viscous lidocaine and 28mL donnatal, Disp: 300 mL, Rfl: 0 .  aspirin EC 81 MG tablet, Take 81 mg by mouth daily. , Disp: , Rfl:  .  BREO ELLIPTA 200-25 MCG/INH AEPB, Inhale 1 puff into the lungs daily., Disp: , Rfl: 6 .  levothyroxine (SYNTHROID, LEVOTHROID) 100 MCG tablet, Take 100 mcg by mouth daily before breakfast. , Disp: , Rfl:  .  lisinopril-hydrochlorothiazide (PRINZIDE,ZESTORETIC) 20-25 MG tablet, Take 1 tablet by mouth daily. , Disp: , Rfl:  .  gabapentin (NEURONTIN) 100 MG capsule, Take 1 capsule (100 mg total) by mouth 3 (three) times daily., Disp: 90  capsule, Rfl: 3 .  simvastatin (ZOCOR) 40 MG tablet, TAKE 1 TABLET BY MOUTH AT BEDTIME, Disp: 90 tablet, Rfl: 0   Allergies  Allergen Reactions  . Codeine Nausea Only    ROS Review of Systems  Constitutional: Negative for appetite change and diaphoresis.  HENT: Negative.   Eyes: Negative.   Respiratory: Negative.   Cardiovascular: Negative for chest pain.  Gastrointestinal: Negative for abdominal distention and abdominal pain.  Endocrine: Negative for cold intolerance.  Genitourinary: Positive for difficulty urinating.  Musculoskeletal: Negative for arthralgias.       Pt has neuropathy of both legs  Psychiatric/Behavioral: Negative.       Objective:    Physical Exam Constitutional:      Appearance: Normal appearance. She is obese.  HENT:     Nose: Nose normal.     Mouth/Throat:     Mouth: Mucous membranes are moist.  Eyes:     Conjunctiva/sclera:  Conjunctivae normal.     Pupils: Pupils are equal, round, and reactive to light.  Neck:     Vascular: No carotid bruit.  Cardiovascular:     Rate and Rhythm: Normal rate and regular rhythm.     Pulses: Normal pulses.  Pulmonary:     Effort: Pulmonary effort is normal. No respiratory distress.     Breath sounds: No rhonchi.  Abdominal:     General: There is distension.  Musculoskeletal:     Cervical back: No rigidity or tenderness.     Right lower leg: Edema present.     Left lower leg: Edema present.  Lymphadenopathy:     Cervical: No cervical adenopathy.  Neurological:     Mental Status: She is alert and oriented to person, place, and time. Mental status is at baseline.  Psychiatric:        Mood and Affect: Mood normal.        Thought Content: Thought content normal.     There were no vitals taken for this visit. Wt Readings from Last 3 Encounters:  03/28/20 149 lb 8 oz (67.8 kg)  05/04/18 150 lb (68 kg)  06/16/17 155 lb (70.3 kg)     Health Maintenance Due  Topic Date Due  . COVID-19 Vaccine (1) Never done  . TETANUS/TDAP  Never done  . DEXA SCAN  Never done  . PNA vac Low Risk Adult (1 of 2 - PCV13) Never done    There are no preventive care reminders to display for this patient.  No results found for: TSH Lab Results  Component Value Date   WBC 7.3 05/04/2018   HGB 12.6 05/04/2018   HCT 35.4 05/04/2018   MCV 96.2 05/04/2018   PLT 222 05/04/2018   Lab Results  Component Value Date   NA 136 04/03/2020   K 4.8 04/03/2020   CO2 24 04/03/2020   GLUCOSE 88 04/03/2020   BUN 21 04/03/2020   CREATININE 1.32 (H) 04/03/2020   BILITOT 0.7 04/03/2020   ALKPHOS 44 11/30/2015   AST 18 04/03/2020   ALT 10 04/03/2020   PROT 6.9 04/03/2020   ALBUMIN 4.1 11/30/2015   CALCIUM 10.4 04/03/2020   ANIONGAP 6 05/04/2018   No results found for: CHOL No results found for: HDL No results found for: LDLCALC No results found for: TRIG No results found for: CHOLHDL No  results found for: HGBA1C    Assessment & Plan:   Problem List Items Addressed This Visit      Cardiovascular and Mediastinum   Essential  hypertension    - Today, the patient's blood pressure is well managed on current med. - The patient willthe current treatment regimen.  - I encouraged the patient to eat a low-sodium diet to help control blood pressure. - I encouraged the patient to live an active lifestyle and complete activities that increases heart rate to 85% target heart rate at least 5 times per week for one hour.           Other   Anxiety disorder    - Patient experiencing high levels of anxiety.  - Encouraged patient to engage in relaxing activities like yoga, meditation, journaling, going for a walk, or participating in a hobby.  - Encouraged patient to reach out to trusted friends or family members about recent struggles      Right shoulder pain    Right shoulder pain is most likely due to rotator cuff syndrome.  She was advised to see an orthopedic surgeon.   I will give her a steroid shot. If she wanted      Annual physical exam - Primary    Patient has a normal physical exam.  He has a problem with neuropathy gabapentin  BP.   marginally elevated.  Patient complaining of right shoulder pain which is due to rotator cuff syndrome.         No orders of the defined types were placed in this encounter.   Follow-up: No follow-ups on file.    Cletis Athens, MD

## 2020-05-06 NOTE — Assessment & Plan Note (Signed)
-   Today, the patient's blood pressure is well managed on current med. - The patient willthe current treatment regimen.  - I encouraged the patient to eat a low-sodium diet to help control blood pressure. - I encouraged the patient to live an active lifestyle and complete activities that increases heart rate to 85% target heart rate at least 5 times per week for one hour.

## 2020-05-07 ENCOUNTER — Encounter: Payer: Self-pay | Admitting: Internal Medicine

## 2020-05-07 ENCOUNTER — Other Ambulatory Visit: Payer: Self-pay

## 2020-05-07 ENCOUNTER — Ambulatory Visit (INDEPENDENT_AMBULATORY_CARE_PROVIDER_SITE_OTHER): Payer: Medicare Other | Admitting: Internal Medicine

## 2020-05-07 VITALS — BP 162/70 | HR 50 | Ht 65.0 in | Wt 145.0 lb

## 2020-05-07 DIAGNOSIS — I10 Essential (primary) hypertension: Secondary | ICD-10-CM

## 2020-05-07 DIAGNOSIS — F419 Anxiety disorder, unspecified: Secondary | ICD-10-CM

## 2020-05-07 DIAGNOSIS — M25511 Pain in right shoulder: Secondary | ICD-10-CM | POA: Diagnosis not present

## 2020-05-07 MED ORDER — ALPRAZOLAM 0.25 MG PO TABS: 0.2500 mg | ORAL_TABLET | Freq: Two times a day (BID) | ORAL | 0 refills | Status: DC

## 2020-05-07 NOTE — Assessment & Plan Note (Signed)
-   Today, the patient's blood pressure is well managed on present med. - The patient will continue the current treatment regimen.  - I encouraged the patient to eat a low-sodium diet to help control blood pressure. - I encouraged the patient to live an active lifestyle and complete activities that increases heart rate to 85% target heart rate at least 5 times per week for one hour.     

## 2020-05-07 NOTE — Progress Notes (Signed)
Established Patient Office Visit  SUBJECTIVE:  Subjective  Patient ID: Gail Mora, female    DOB: February 28, 1933  Age: 86 y.o. MRN: [id number]  CC:  Chief Complaint  Patient presents with  . Anxiety    patient needs refill xanax     HPI Gail Mora is a 86 y.o. female presenting today for anxiety follow-up.  Today she reports feeling fine. She still reports pain in her right upper arm, though it is 40% better after the last steroid injection.  She continues taking Xanax 1 tablet daily for her anxiety. She is compliant with her medications.  Past Medical History:  Diagnosis Date  . GERD (gastroesophageal reflux disease)   . Hyperlipemia   . Hypertension   . Hypothyroid     Past Surgical History:  Procedure Laterality Date  . ABDOMINAL HYSTERECTOMY    . BACK SURGERY    . CATARACT EXTRACTION    . Knee replacemt  2015   Left knee  . RETINAL TEAR REPAIR CRYOTHERAPY      Family History  Problem Relation Age of Onset  . Bladder Cancer Neg Hx   . Kidney cancer Neg Hx     Social History   Socioeconomic History  . Marital status: Married    Spouse name: Not on file  . Number of children: Not on file  . Years of education: Not on file  . Highest education level: Not on file  Occupational History  . Not on file  Tobacco Use  . Smoking status: Former Games developer  . Smokeless tobacco: Never Used  Substance and Sexual Activity  . Alcohol use: No  . Drug use: No  . Sexual activity: Not on file  Other Topics Concern  . Not on file  Social History Narrative  . Not on file   Social Determinants of Health   Financial Resource Strain:   . Difficulty of Paying Living Expenses:   Food Insecurity:   . Worried About Programme researcher, broadcasting/film/video in the Last Year:   . Barista in the Last Year:   Transportation Needs:   . Freight forwarder (Medical):   Marland Kitchen Lack of Transportation (Non-Medical):   Physical Activity:   . Days of Exercise per Week:   . Minutes of Exercise  per Session:   Stress:   . Feeling of Stress :   Social Connections:   . Frequency of Communication with Friends and Family:   . Frequency of Social Gatherings with Friends and Family:   . Attends Religious Services:   . Active Member of Clubs or Organizations:   . Attends Banker Meetings:   Marland Kitchen Marital Status:   Intimate Partner Violence:   . Fear of Current or Ex-Partner:   . Emotionally Abused:   Marland Kitchen Physically Abused:   . Sexually Abused:      Current Outpatient Medications:  .  allopurinol (ZYLOPRIM) 100 MG tablet, Take 100 mg by mouth daily. , Disp: , Rfl:  .  ALPRAZolam (XANAX) 0.25 MG tablet, Take 1 tablet (0.25 mg total) by mouth 2 (two) times daily., Disp: 60 tablet, Rfl: 0 .  Alum & Mag Hydroxide-Simeth (GI COCKTAIL) SUSP suspension, Take 30 mLs by mouth 2 (two) times daily as needed for indigestion (abd pain). Shake well. Each dose to containe 59mL maalox and 18mL viscous lidocaine and 53mL donnatal, Disp: 300 mL, Rfl: 0 .  aspirin EC 81 MG tablet, Take 81 mg by mouth daily. , Disp: ,  Rfl:  .  BREO ELLIPTA 200-25 MCG/INH AEPB, Inhale 1 puff into the lungs daily., Disp: , Rfl: 6 .  gabapentin (NEURONTIN) 100 MG capsule, Take 1 capsule (100 mg total) by mouth 3 (three) times daily., Disp: 90 capsule, Rfl: 3 .  levothyroxine (SYNTHROID, LEVOTHROID) 100 MCG tablet, Take 100 mcg by mouth daily before breakfast. , Disp: , Rfl:  .  lisinopril-hydrochlorothiazide (PRINZIDE,ZESTORETIC) 20-25 MG tablet, Take 1 tablet by mouth daily. , Disp: , Rfl:  .  simvastatin (ZOCOR) 40 MG tablet, TAKE 1 TABLET BY MOUTH AT BEDTIME, Disp: 90 tablet, Rfl: 0   Allergies  Allergen Reactions  . Codeine Nausea Only    ROS Review of Systems  Constitutional: Negative.   HENT: Positive for hearing loss.   Eyes: Negative.   Respiratory: Negative.   Cardiovascular: Negative.   Gastrointestinal: Negative.   Endocrine: Negative.   Genitourinary: Negative.   Musculoskeletal: Negative.     Skin: Negative.   Allergic/Immunologic: Negative.   Neurological: Negative.   Hematological: Negative.   Psychiatric/Behavioral: The patient is nervous/anxious.   All other systems reviewed and are negative.    OBJECTIVE:    Physical Exam Vitals reviewed.  Constitutional:      Appearance: Normal appearance.  HENT:     Mouth/Throat:     Mouth: Mucous membranes are moist.  Eyes:     Pupils: Pupils are equal, round, and reactive to light.  Neck:     Vascular: No carotid bruit.  Cardiovascular:     Rate and Rhythm: Normal rate and regular rhythm.     Pulses: Normal pulses.     Heart sounds: Normal heart sounds.  Pulmonary:     Effort: Pulmonary effort is normal.     Breath sounds: Normal breath sounds.  Abdominal:     Palpations: There is no hepatomegaly, splenomegaly or mass.     Tenderness: There is no abdominal tenderness.  Musculoskeletal:     Right lower leg: No edema.     Left lower leg: No edema.  Neurological:     Mental Status: She is alert and oriented to person, place, and time.  Psychiatric:        Mood and Affect: Mood and affect normal.        Behavior: Behavior normal.     BP (!) 162/70   Pulse (!) 50   Ht 5\' 5"  (1.651 m)   Wt 145 lb (65.8 kg)   BMI 24.13 kg/m  Wt Readings from Last 3 Encounters:  05/07/20 145 lb (65.8 kg)  03/28/20 149 lb 8 oz (67.8 kg)  05/04/18 150 lb (68 kg)    Health Maintenance Due  Topic Date Due  . COVID-19 Vaccine (1) Never done  . TETANUS/TDAP  Never done  . DEXA SCAN  Never done  . PNA vac Low Risk Adult (1 of 2 - PCV13) Never done    There are no preventive care reminders to display for this patient.  CBC Latest Ref Rng & Units 05/04/2018 06/16/2017 05/09/2016  WBC 3.6 - 11.0 K/uL 7.3 7.5 7.8  Hemoglobin 12.0 - 16.0 g/dL 05/11/2016 24.4 10.4(L)  Hematocrit 35 - 47 % 35.4 35.1 30.9(L)  Platelets 150 - 440 K/uL 222 237 242   CMP Latest Ref Rng & Units 04/03/2020 05/04/2018 06/16/2017  Glucose 65 - 99 mg/dL 88 08/16/2017)  272(Z)  BUN 7 - 25 mg/dL 21 366(Y) 20  Creatinine 0.60 - 0.88 mg/dL 40(H) 4.74(Q) 5.95(G)  Sodium 135 - 146 mmol/L 136  137 136  Potassium 3.5 - 5.3 mmol/L 4.8 4.7 3.9  Chloride 98 - 110 mmol/L 100 105 103  CO2 20 - 32 mmol/L 24 26 26   Calcium 8.6 - 10.4 mg/dL 10.4 10.4(H) 10.1  Total Protein 6.1 - 8.1 g/dL 6.9 - -  Total Bilirubin 0.2 - 1.2 mg/dL 0.7 - -  Alkaline Phos 38 - 126 U/L - - -  AST 10 - 35 U/L 18 - -  ALT 6 - 29 U/L 10 - -    No results found for: TSH Lab Results  Component Value Date   ALBUMIN 4.1 11/30/2015   ANIONGAP 6 05/04/2018   No results found for: CHOL, HDL, LDLCALC, CHOLHDL No results found for: TRIG No results found for: HGBA1C    ASSESSMENT & PLAN:   Problem List Items Addressed This Visit      Cardiovascular and Mediastinum   Essential hypertension    - Today, the patient's blood pressure is well managed on present med - The patient will continue the current treatment regimen.  - I encouraged the patient to eat a low-sodium diet to help control blood pressure. - I encouraged the patient to live an active lifestyle and complete activities that increases heart rate to 85% target heart rate at least 5 times per week for one hour.           Other   Anxiety disorder - Primary    - Patient experiencing high levels of anxiety.  - Encouraged patient to engage in relaxing activities like yoga, meditation, journaling, going for a walk, or participating in a hobby.  - Encouraged patient to reach out to trusted friends or family members about recent struggles      Relevant Medications   ALPRAZolam (XANAX) 0.25 MG tablet   Right shoulder pain    Patient has received the injection for the right shoulder and she says.  Improved about 40%.         Meds ordered this encounter  Medications  . ALPRAZolam (XANAX) 0.25 MG tablet    Sig: Take 1 tablet (0.25 mg total) by mouth 2 (two) times daily.    Dispense:  60 tablet    Refill:  0       Follow-up: Return in about 2 months (around 07/07/2020).    Dr. Jane Canary [facility] [address], [place], [place] [address]   By signing my name below, I, Milinda Antis, attest that this documentation has been prepared under the direction and in the presence of Cletis Athens, MD. Electronically Signed: Cletis Athens, MD 05/07/20, 12:09 PM   I personally performed the services described in this documentation, which was SCRIBED in my presence. The recorded information has been reviewed and considered accurate. It has been edited as necessary during review. Cletis Athens, MD

## 2020-05-07 NOTE — Assessment & Plan Note (Signed)
Patient has received the injection for the right shoulder and she says.  Improved about 40%.

## 2020-05-07 NOTE — Assessment & Plan Note (Signed)
-   Patient experiencing high levels of anxiety.  - Encouraged patient to engage in relaxing activities like yoga, meditation, journaling, going for a walk, or participating in a hobby.  - Encouraged patient to reach out to trusted friends or family members about recent struggles 

## 2020-05-12 ENCOUNTER — Telehealth: Payer: Self-pay | Admitting: Internal Medicine

## 2020-05-12 MED ORDER — LISINOPRIL-HYDROCHLOROTHIAZIDE 20-25 MG PO TABS: 1.0000 | ORAL_TABLET | Freq: Every day | ORAL | 3 refills | Status: DC

## 2020-05-12 NOTE — Telephone Encounter (Signed)
Patient needs lisinopril called in to [facility]. Patient is out.

## 2020-05-17 ENCOUNTER — Other Ambulatory Visit: Payer: Self-pay | Admitting: Internal Medicine

## 2020-05-23 DIAGNOSIS — H903 Sensorineural hearing loss, bilateral: Secondary | ICD-10-CM | POA: Diagnosis not present

## 2020-05-23 DIAGNOSIS — H6123 Impacted cerumen, bilateral: Secondary | ICD-10-CM | POA: Diagnosis not present

## 2020-05-26 ENCOUNTER — Other Ambulatory Visit: Payer: Self-pay | Admitting: Internal Medicine

## 2020-06-30 DIAGNOSIS — H35342 Macular cyst, hole, or pseudohole, left eye: Secondary | ICD-10-CM | POA: Diagnosis not present

## 2020-07-02 ENCOUNTER — Ambulatory Visit (INDEPENDENT_AMBULATORY_CARE_PROVIDER_SITE_OTHER): Payer: Medicare Other | Admitting: Internal Medicine

## 2020-07-02 ENCOUNTER — Encounter: Payer: Self-pay | Admitting: Internal Medicine

## 2020-07-02 ENCOUNTER — Other Ambulatory Visit: Payer: Self-pay | Admitting: Internal Medicine

## 2020-07-02 ENCOUNTER — Other Ambulatory Visit: Payer: Self-pay

## 2020-07-02 VITALS — BP 158/66 | HR 61 | Temp 96.5°F | Ht 62.0 in | Wt 145.0 lb

## 2020-07-02 DIAGNOSIS — I10 Essential (primary) hypertension: Secondary | ICD-10-CM | POA: Diagnosis not present

## 2020-07-02 DIAGNOSIS — E785 Hyperlipidemia, unspecified: Secondary | ICD-10-CM | POA: Diagnosis not present

## 2020-07-02 DIAGNOSIS — F419 Anxiety disorder, unspecified: Secondary | ICD-10-CM | POA: Diagnosis not present

## 2020-07-02 DIAGNOSIS — R5383 Other fatigue: Secondary | ICD-10-CM

## 2020-07-02 DIAGNOSIS — M25511 Pain in right shoulder: Secondary | ICD-10-CM | POA: Diagnosis not present

## 2020-07-02 DIAGNOSIS — Z0000 Encounter for general adult medical examination without abnormal findings: Secondary | ICD-10-CM

## 2020-07-02 DIAGNOSIS — R5381 Other malaise: Secondary | ICD-10-CM

## 2020-07-02 MED ORDER — SIMVASTATIN 40 MG PO TABS: 40.0000 mg | ORAL_TABLET | Freq: Every day | ORAL | 0 refills | Status: DC

## 2020-07-02 MED ORDER — ALPRAZOLAM 0.25 MG PO TABS: 0.2500 mg | ORAL_TABLET | Freq: Two times a day (BID) | ORAL | 0 refills | Status: DC

## 2020-07-02 NOTE — Assessment & Plan Note (Signed)
-   Today, the patient's blood pressure is well managed on lisinopril. - The patient will continue the current treatment regimen.  - I encouraged the patient to eat a low-sodium diet to help control blood pressure. - I encouraged the patient to live an active lifestyle and complete activities that increases heart rate to 85% target heart rate at least 5 times per week for one hour.     

## 2020-07-02 NOTE — Progress Notes (Signed)
Established Patient Office Visit  SUBJECTIVE:  Subjective  Patient ID: Gail Mora, female    DOB: Mar 26, 1933  Age: 86 y.o. MRN: [id number]  CC:  Chief Complaint  Patient presents with  . Follow-up    medication    HPI Gail Mora is a 86 y.o. female presenting today for a medication refill.   She is vaccinated against COVID19.   She is taking all of her medications without complications. She denies any missed doses. She needs refills of her prescriptions today.   Her shoulder pain is ok today, but she does note that it comes and goes. She does continue to have leg pain at night.   Past Medical History:  Diagnosis Date  . GERD (gastroesophageal reflux disease)   . Hyperlipemia   . Hypertension   . Hypothyroid     Past Surgical History:  Procedure Laterality Date  . ABDOMINAL HYSTERECTOMY    . BACK SURGERY    . CATARACT EXTRACTION    . Knee replacemt  2015   Left knee  . RETINAL TEAR REPAIR CRYOTHERAPY      Family History  Problem Relation Age of Onset  . Bladder Cancer Neg Hx   . Kidney cancer Neg Hx     Social History   Socioeconomic History  . Marital status: Married    Spouse name: Not on file  . Number of children: Not on file  . Years of education: Not on file  . Highest education level: Not on file  Occupational History  . Not on file  Tobacco Use  . Smoking status: Former Games developer  . Smokeless tobacco: Never Used  Substance and Sexual Activity  . Alcohol use: No  . Drug use: No  . Sexual activity: Not on file  Other Topics Concern  . Not on file  Social History Narrative  . Not on file   Social Determinants of Health   Financial Resource Strain:   . Difficulty of Paying Living Expenses:   Food Insecurity:   . Worried About Programme researcher, broadcasting/film/video in the Last Year:   . Barista in the Last Year:   Transportation Needs:   . Freight forwarder (Medical):   Marland Kitchen Lack of Transportation (Non-Medical):   Physical Activity:   . Days  of Exercise per Week:   . Minutes of Exercise per Session:   Stress:   . Feeling of Stress :   Social Connections:   . Frequency of Communication with Friends and Family:   . Frequency of Social Gatherings with Friends and Family:   . Attends Religious Services:   . Active Member of Clubs or Organizations:   . Attends Banker Meetings:   Marland Kitchen Marital Status:   Intimate Partner Violence:   . Fear of Current or Ex-Partner:   . Emotionally Abused:   Marland Kitchen Physically Abused:   . Sexually Abused:      Current Outpatient Medications:  .  allopurinol (ZYLOPRIM) 100 MG tablet, Take 1 tablet by mouth once daily, Disp: 90 tablet, Rfl: 0 .  ALPRAZolam (XANAX) 0.25 MG tablet, Take 1 tablet (0.25 mg total) by mouth 2 (two) times daily., Disp: 60 tablet, Rfl: 0 .  Alum & Mag Hydroxide-Simeth (GI COCKTAIL) SUSP suspension, Take 30 mLs by mouth 2 (two) times daily as needed for indigestion (abd pain). Shake well. Each dose to containe 11mL maalox and 71mL viscous lidocaine and 78mL donnatal, Disp: 300 mL, Rfl: 0 .  aspirin EC 81 MG tablet, Take 81 mg by mouth daily. , Disp: , Rfl:  .  gabapentin (NEURONTIN) 100 MG capsule, Take 1 capsule (100 mg total) by mouth 3 (three) times daily., Disp: 90 capsule, Rfl: 3 .  levothyroxine (SYNTHROID, LEVOTHROID) 100 MCG tablet, Take 100 mcg by mouth daily before breakfast. , Disp: , Rfl:  .  lisinopril-hydrochlorothiazide (ZESTORETIC) 20-25 MG tablet, Take 1 tablet by mouth daily., Disp: 90 tablet, Rfl: 3 .  simvastatin (ZOCOR) 40 MG tablet, Take 1 tablet (40 mg total) by mouth at bedtime., Disp: 90 tablet, Rfl: 0 .  BREO ELLIPTA 200-25 MCG/INH AEPB, Inhale 1 puff by mouth once daily, Disp: 60 each, Rfl: 1 .  levothyroxine (SYNTHROID) 150 MCG tablet, Take 1 tablet by mouth once daily, Disp: 90 tablet, Rfl: 3   Allergies  Allergen Reactions  . Codeine Nausea Only    ROS Review of Systems  Constitutional: Negative.   HENT: Negative.   Eyes: Negative.     Respiratory: Negative.  Negative for cough, chest tightness and shortness of breath.   Cardiovascular: Negative.  Negative for chest pain.  Gastrointestinal: Negative.   Endocrine: Negative.   Genitourinary: Negative.   Musculoskeletal: Positive for myalgias (legs).  Skin: Negative.   Allergic/Immunologic: Negative.   Hematological: Negative.   Psychiatric/Behavioral: Negative.   All other systems reviewed and are negative.    OBJECTIVE:    Physical Exam Vitals reviewed.  Constitutional:      Appearance: Normal appearance.  HENT:     Mouth/Throat:     Mouth: Mucous membranes are moist.  Eyes:     Pupils: Pupils are equal, round, and reactive to light.  Neck:     Vascular: No carotid bruit.  Cardiovascular:     Rate and Rhythm: Normal rate and regular rhythm.     Pulses: Normal pulses.     Heart sounds: Normal heart sounds.  Pulmonary:     Effort: Pulmonary effort is normal.     Breath sounds: Normal breath sounds.  Abdominal:     General: Bowel sounds are normal.     Palpations: Abdomen is soft. There is no hepatomegaly, splenomegaly or mass.     Tenderness: There is no abdominal tenderness.     Hernia: No hernia is present.  Musculoskeletal:        General: No tenderness.     Cervical back: Neck supple.     Right lower leg: No edema.     Left lower leg: No edema.  Skin:    Findings: No rash.  Neurological:     Mental Status: She is alert and oriented to person, place, and time.     Motor: No weakness.  Psychiatric:        Mood and Affect: Mood and affect normal.        Behavior: Behavior normal.     BP (!) 158/66   Pulse 61   Temp (!) 96.5 F (35.8 C)   Ht 5\' 2"  (1.575 m)   Wt 145 lb (65.8 kg)   BMI 26.52 kg/m  Wt Readings from Last 3 Encounters:  07/02/20 145 lb (65.8 kg)  05/07/20 145 lb (65.8 kg)  03/28/20 149 lb 8 oz (67.8 kg)    Health Maintenance Due  Topic Date Due  . COVID-19 Vaccine (1) Never done  . TETANUS/TDAP  Never done  . DEXA  SCAN  Never done  . PNA vac Low Risk Adult (1 of 2 - PCV13) Never done  .  INFLUENZA VACCINE  06/15/2020    There are no preventive care reminders to display for this patient.  CBC Latest Ref Rng & Units 05/04/2018 06/16/2017 05/09/2016  WBC 3.6 - 11.0 K/uL 7.3 7.5 7.8  Hemoglobin 12.0 - 16.0 g/dL 19.112.6 47.812.3 10.4(L)  Hematocrit 35 - 47 % 35.4 35.1 30.9(L)  Platelets 150 - 440 K/uL 222 237 242   CMP Latest Ref Rng & Units 04/03/2020 05/04/2018 06/16/2017  Glucose 65 - 99 mg/dL 88 295(A110(H) 213(Y107(H)  BUN 7 - 25 mg/dL 21 86(V29(H) 20  Creatinine 0.60 - 0.88 mg/dL 7.84(O1.32(H) 9.62(X1.38(H) 5.28(U1.23(H)  Sodium 135 - 146 mmol/L 136 137 136  Potassium 3.5 - 5.3 mmol/L 4.8 4.7 3.9  Chloride 98 - 110 mmol/L 100 105 103  CO2 20 - 32 mmol/L 24 26 26   Calcium 8.6 - 10.4 mg/dL 13.210.4 10.4(H) 10.1  Total Protein 6.1 - 8.1 g/dL 6.9 - -  Total Bilirubin 0.2 - 1.2 mg/dL 0.7 - -  Alkaline Phos 38 - 126 U/L - - -  AST 10 - 35 U/L 18 - -  ALT 6 - 29 U/L 10 - -    No results found for: TSH Lab Results  Component Value Date   ALBUMIN 4.1 11/30/2015   ANIONGAP 6 05/04/2018   No results found for: CHOL, HDL, LDLCALC, CHOLHDL No results found for: TRIG No results found for: HGBA1C    ASSESSMENT & PLAN:   Problem List Items Addressed This Visit      Cardiovascular and Mediastinum   Essential hypertension    - Today, the patient's blood pressure is well managed on lisinopril . - The patient will continue the current treatment regimen.  - I encouraged the patient to eat a low-sodium diet to help control blood pressure. - I encouraged the patient to live an active lifestyle and complete activities that increases heart rate to 85% target heart rate at least 5 times per week for one hour.          Relevant Medications   simvastatin (ZOCOR) 40 MG tablet     Other   Anxiety disorder - Primary    - Patient experiencing high levels of anxiety.  - Encouraged patient to engage in relaxing activities like yoga, meditation,  journaling, going for a walk, or participating in a hobby.  - Encouraged patient to reach out to trusted friends or family members about recent struggles       Relevant Medications   ALPRAZolam (XANAX) 0.25 MG tablet   Right shoulder pain    Patient was advised shoulder exercises on the both shoulder      Hyperlipidemia    - The patient's hyperlipidemia is stable on simva. - The patient will continue the current treatment regimen.  - I encouraged the patient to eat more vegetables and whole wheat, and to avoid fatty foods like whole milk, hard cheese, egg yolks, margarine, baked sweets, and fried foods.  - I encouraged the patient to live an active lifestyle and complete activities for 40 minutes at least three times per week.  - I instructed the patient to go to the ER if they begin having chest pain.       Relevant Medications   simvastatin (ZOCOR) 40 MG tablet      Meds ordered this encounter  Medications  . ALPRAZolam (XANAX) 0.25 MG tablet    Sig: Take 1 tablet (0.25 mg total) by mouth 2 (two) times daily.    Dispense:  60 tablet  Refill:  0  . simvastatin (ZOCOR) 40 MG tablet    Sig: Take 1 tablet (40 mg total) by mouth at bedtime.    Dispense:  90 tablet    Refill:  0      Follow-up: No follow-ups on file.    Dr. Woodroe Chen [facility] [address], [place], [place] [address]   By signing my name below, I, [employer], attest that this documentation has been prepared under the direction and in the presence of Corky Downs, MD. Electronically Signed: Corky Downs, MD 07/02/20, 2:38 PM    I personally performed the services described in this documentation, which was SCRIBED in my presence. The recorded information has been reviewed and considered accurate. It has been edited as necessary during review. Corky Downs, MD

## 2020-07-02 NOTE — Assessment & Plan Note (Signed)
Patient was advised shoulder exercises on the both shoulder

## 2020-07-02 NOTE — Assessment & Plan Note (Signed)
-   Patient experiencing high levels of anxiety.  - Encouraged patient to engage in relaxing activities like yoga, meditation, journaling, going for a walk, or participating in a hobby.  - Encouraged patient to reach out to trusted friends or family members about recent struggles 

## 2020-07-02 NOTE — Progress Notes (Signed)
Gail Mora is a 86 y.o. female here for a f/u for more refills. Pt is having no new sx

## 2020-07-02 NOTE — Assessment & Plan Note (Signed)
-   The patient's hyperlipidemia is stable on simva . - The patient will continue the current treatment regimen.  - I encouraged the patient to eat more vegetables and whole wheat, and to avoid fatty foods like whole milk, hard cheese, egg yolks, margarine, baked sweets, and fried foods.  - I encouraged the patient to live an active lifestyle and complete activities for 40 minutes at least three times per week.  - I instructed the patient to go to the ER if they begin having chest pain.  

## 2020-07-04 ENCOUNTER — Other Ambulatory Visit: Payer: Self-pay | Admitting: *Deleted

## 2020-07-04 DIAGNOSIS — R5381 Other malaise: Secondary | ICD-10-CM

## 2020-07-04 DIAGNOSIS — R5383 Other fatigue: Secondary | ICD-10-CM

## 2020-07-04 MED ORDER — LEVOTHYROXINE SODIUM 150 MCG PO TABS: 150.0000 ug | ORAL_TABLET | Freq: Every day | ORAL | 3 refills | Status: DC

## 2020-07-17 ENCOUNTER — Other Ambulatory Visit: Payer: Self-pay

## 2020-07-17 MED ORDER — LEVOTHYROXINE SODIUM 100 MCG PO TABS: 100.0000 ug | ORAL_TABLET | Freq: Every day | ORAL | 3 refills | Status: DC

## 2020-08-10 ENCOUNTER — Other Ambulatory Visit: Payer: Self-pay | Admitting: Internal Medicine

## 2020-08-19 ENCOUNTER — Encounter: Payer: Self-pay | Admitting: Internal Medicine

## 2020-08-19 ENCOUNTER — Other Ambulatory Visit: Payer: Self-pay

## 2020-08-19 ENCOUNTER — Ambulatory Visit (INDEPENDENT_AMBULATORY_CARE_PROVIDER_SITE_OTHER): Payer: Medicare Other | Admitting: Internal Medicine

## 2020-08-19 VITALS — BP 159/73 | HR 72 | Ht 64.0 in | Wt 140.7 lb

## 2020-08-19 DIAGNOSIS — M25511 Pain in right shoulder: Secondary | ICD-10-CM

## 2020-08-19 DIAGNOSIS — E785 Hyperlipidemia, unspecified: Secondary | ICD-10-CM

## 2020-08-19 DIAGNOSIS — F419 Anxiety disorder, unspecified: Secondary | ICD-10-CM | POA: Diagnosis not present

## 2020-08-19 DIAGNOSIS — I10 Essential (primary) hypertension: Secondary | ICD-10-CM | POA: Diagnosis not present

## 2020-08-19 MED ORDER — ALPRAZOLAM 0.25 MG PO TABS: 0.2500 mg | ORAL_TABLET | Freq: Two times a day (BID) | ORAL | 0 refills | Status: DC

## 2020-08-19 NOTE — Assessment & Plan Note (Signed)
Patient has right shoulder pain more than the left shoulder.  She has trouble raising her right arm above the shoulder.  Intermittent intermittently she needs a cortisone shot

## 2020-08-19 NOTE — Assessment & Plan Note (Signed)
-   Patient experiencing high levels of anxiety.  - Encouraged patient to engage in relaxing activities like yoga, meditation, journaling, going for a walk, or participating in a hobby.  - Encouraged patient to reach out to trusted friends or family members about recent struggles 

## 2020-08-19 NOTE — Assessment & Plan Note (Signed)
-   Today, the patient's blood pressure is well managed on lisinopril. - The patient will continue the current treatment regimen.  - I encouraged the patient to eat a low-sodium diet to help control blood pressure. - I encouraged the patient to live an active lifestyle and complete activities that increases heart rate to 85% target heart rate at least 5 times per week for one hour.     

## 2020-08-19 NOTE — Progress Notes (Signed)
Established Patient Office Visit  SUBJECTIVE:  Subjective  Patient ID: Gail Mora, female    DOB: [birth year]  Age: 86 y.o. MRN: [id number]  CC:  Chief Complaint  Patient presents with  . Anxiety    patient needs refill of xanax     HPI Gail Mora is a 86 y.o. female presenting today for a refill of her anxiety medication.   Anxiety Presents for follow-up visit. Symptoms include excessive worry, nervous/anxious behavior and restlessness. Patient reports no feeling of choking or suicidal ideas. Symptoms occur most days. The severity of symptoms is moderate and causing significant distress. The quality of sleep is fair.    She notes that she is going to try to get home health in to get some help at home for a husband.     Past Medical History:  Diagnosis Date  . GERD (gastroesophageal reflux disease)   . Hyperlipemia   . Hypertension   . Hypothyroid     Past Surgical History:  Procedure Laterality Date  . ABDOMINAL HYSTERECTOMY    . BACK SURGERY    . CATARACT EXTRACTION    . Knee replacemt  2015   Left knee  . RETINAL TEAR REPAIR CRYOTHERAPY      Family History  Problem Relation Age of Onset  . Bladder Cancer Neg Hx   . Kidney cancer Neg Hx     Social History   Socioeconomic History  . Marital status: Married    Spouse name: Not on file  . Number of children: Not on file  . Years of education: Not on file  . Highest education level: Not on file  Occupational History  . Not on file  Tobacco Use  . Smoking status: Former Games developer  . Smokeless tobacco: Never Used  Substance and Sexual Activity  . Alcohol use: No  . Drug use: No  . Sexual activity: Not on file  Other Topics Concern  . Not on file  Social History Narrative  . Not on file   Social Determinants of Health   Financial Resource Strain:   . Difficulty of Paying Living Expenses: Not on file  Food Insecurity:   . Worried About Programme researcher, broadcasting/film/video in the Last Year: Not on file  . Ran Out  of Food in the Last Year: Not on file  Transportation Needs:   . Lack of Transportation (Medical): Not on file  . Lack of Transportation (Non-Medical): Not on file  Physical Activity:   . Days of Exercise per Week: Not on file  . Minutes of Exercise per Session: Not on file  Stress:   . Feeling of Stress : Not on file  Social Connections:   . Frequency of Communication with Friends and Family: Not on file  . Frequency of Social Gatherings with Friends and Family: Not on file  . Attends Religious Services: Not on file  . Active Member of Clubs or Organizations: Not on file  . Attends Banker Meetings: Not on file  . Marital Status: Not on file  Intimate Partner Violence:   . Fear of Current or Ex-Partner: Not on file  . Emotionally Abused: Not on file  . Physically Abused: Not on file  . Sexually Abused: Not on file     Current Outpatient Medications:  .  allopurinol (ZYLOPRIM) 100 MG tablet, Take 1 tablet by mouth once daily, Disp: 90 tablet, Rfl: 0 .  ALPRAZolam (XANAX) 0.25 MG tablet, Take 1 tablet (0.25 mg  total) by mouth 2 (two) times daily., Disp: 60 tablet, Rfl: 0 .  Alum & Mag Hydroxide-Simeth (GI COCKTAIL) SUSP suspension, Take 30 mLs by mouth 2 (two) times daily as needed for indigestion (abd pain). Shake well. Each dose to containe 35mL maalox and 83mL viscous lidocaine and 61mL donnatal, Disp: 300 mL, Rfl: 0 .  amLODipine (NORVASC) 5 MG tablet, Take 1 tablet by mouth once daily, Disp: 90 tablet, Rfl: 0 .  aspirin EC 81 MG tablet, Take 81 mg by mouth daily. , Disp: , Rfl:  .  BREO ELLIPTA 200-25 MCG/INH AEPB, Inhale 1 puff by mouth once daily, Disp: 60 each, Rfl: 1 .  gabapentin (NEURONTIN) 100 MG capsule, Take 1 capsule (100 mg total) by mouth 3 (three) times daily., Disp: 90 capsule, Rfl: 3 .  levothyroxine (SYNTHROID) 100 MCG tablet, Take 1 tablet (100 mcg total) by mouth daily before breakfast., Disp: 90 tablet, Rfl: 3 .  lisinopril-hydrochlorothiazide  (ZESTORETIC) 20-25 MG tablet, Take 1 tablet by mouth daily., Disp: 90 tablet, Rfl: 3 .  simvastatin (ZOCOR) 40 MG tablet, Take 1 tablet (40 mg total) by mouth at bedtime., Disp: 90 tablet, Rfl: 0   Allergies  Allergen Reactions  . Codeine Nausea Only    ROS Review of Systems  Psychiatric/Behavioral: Negative for suicidal ideas. The patient is nervous/anxious.      OBJECTIVE:    Physical Exam  BP (!) 159/73   Pulse 72   Ht 5\' 4"  (1.626 m)   Wt 140 lb 11.2 oz (63.8 kg)   BMI 24.15 kg/m  Wt Readings from Last 3 Encounters:  08/19/20 140 lb 11.2 oz (63.8 kg)  07/02/20 145 lb (65.8 kg)  05/07/20 145 lb (65.8 kg)    Health Maintenance Due  Topic Date Due  . COVID-19 Vaccine (1) Never done  . TETANUS/TDAP  Never done  . DEXA SCAN  Never done  . PNA vac Low Risk Adult (1 of 2 - PCV13) Never done  . INFLUENZA VACCINE  06/15/2020    There are no preventive care reminders to display for this patient.  CBC Latest Ref Rng & Units 05/04/2018 06/16/2017 05/09/2016  WBC 3.6 - 11.0 K/uL 7.3 7.5 7.8  Hemoglobin 12.0 - 16.0 g/dL 05/11/2016 39.0 10.4(L)  Hematocrit 35 - 47 % 35.4 35.1 30.9(L)  Platelets 150 - 440 K/uL 222 237 242   CMP Latest Ref Rng & Units 04/03/2020 05/04/2018 06/16/2017  Glucose 65 - 99 mg/dL 88 08/16/2017) 923(R)  BUN 7 - 25 mg/dL 21 007(M) 20  Creatinine 0.60 - 0.88 mg/dL 22(Q) 3.33(L) 4.56(Y)  Sodium 135 - 146 mmol/L 136 137 136  Potassium 3.5 - 5.3 mmol/L 4.8 4.7 3.9  Chloride 98 - 110 mmol/L 100 105 103  CO2 20 - 32 mmol/L 24 26 26   Calcium 8.6 - 10.4 mg/dL 5.63(S 10.4(H) 10.1  Total Protein 6.1 - 8.1 g/dL 6.9 - -  Total Bilirubin 0.2 - 1.2 mg/dL 0.7 - -  Alkaline Phos 38 - 126 U/L - - -  AST 10 - 35 U/L 18 - -  ALT 6 - 29 U/L 10 - -    No results found for: TSH Lab Results  Component Value Date   ALBUMIN 4.1 11/30/2015   ANIONGAP 6 05/04/2018   No results found for: CHOL, HDL, LDLCALC, CHOLHDL No results found for: TRIG No results found for: HGBA1C      ASSESSMENT & PLAN:   Problem List Items Addressed This Visit  Cardiovascular and Mediastinum   Essential hypertension - Primary    - Today, the patient's blood pressure is well managed on lisinopril. - The patient will continue the current treatment regimen.  - I encouraged the patient to eat a low-sodium diet to help control blood pressure. - I encouraged the patient to live an active lifestyle and complete activities that increases heart rate to 85% target heart rate at least 5 times per week for one hour.            Other   Anxiety disorder    - Patient experiencing high levels of anxiety.  - Encouraged patient to engage in relaxing activities like yoga, meditation, journaling, going for a walk, or participating in a hobby.  - Encouraged patient to reach out to trusted friends or family members about recent struggles       Relevant Medications   ALPRAZolam (XANAX) 0.25 MG tablet   Right shoulder pain    Patient has right shoulder pain more than the left shoulder.  She has trouble raising her right arm above the shoulder.  Intermittent intermittently she needs a cortisone shot      Hyperlipidemia      Meds ordered this encounter  Medications  . ALPRAZolam (XANAX) 0.25 MG tablet    Sig: Take 1 tablet (0.25 mg total) by mouth 2 (two) times daily.    Dispense:  60 tablet    Refill:  0      Follow-up: Return in about 1 month (around 09/19/2020).    Corky Downs, MD [facility] [address] [place], [place] [address]   By signing my name below, I, [employer], attest that this documentation has been prepared under the direction and in the presence of Dr. Corky Downs Electronically Signed: Corky Downs, MD 08/19/20, 3:53 PM  I personally performed the services described in this documentation, which was SCRIBED in my presence. The recorded information has been reviewed and considered accurate. It has been edited as necessary during review. Corky Downs, MD

## 2020-09-01 ENCOUNTER — Other Ambulatory Visit: Payer: Self-pay | Admitting: Internal Medicine

## 2020-09-22 ENCOUNTER — Other Ambulatory Visit: Payer: Self-pay

## 2020-09-22 ENCOUNTER — Ambulatory Visit (INDEPENDENT_AMBULATORY_CARE_PROVIDER_SITE_OTHER): Payer: Medicare Other | Admitting: Internal Medicine

## 2020-09-22 ENCOUNTER — Encounter: Payer: Self-pay | Admitting: Internal Medicine

## 2020-09-22 VITALS — BP 152/84 | HR 82 | Ht 65.0 in | Wt 142.0 lb

## 2020-09-22 DIAGNOSIS — I10 Essential (primary) hypertension: Secondary | ICD-10-CM

## 2020-09-22 DIAGNOSIS — E785 Hyperlipidemia, unspecified: Secondary | ICD-10-CM | POA: Diagnosis not present

## 2020-09-22 DIAGNOSIS — F419 Anxiety disorder, unspecified: Secondary | ICD-10-CM

## 2020-09-22 DIAGNOSIS — Z23 Encounter for immunization: Secondary | ICD-10-CM

## 2020-09-22 MED ORDER — ALPRAZOLAM 0.25 MG PO TABS: 0.2500 mg | ORAL_TABLET | Freq: Two times a day (BID) | ORAL | 0 refills | Status: DC

## 2020-09-28 ENCOUNTER — Encounter: Payer: Self-pay | Admitting: Internal Medicine

## 2020-09-28 DIAGNOSIS — Z23 Encounter for immunization: Secondary | ICD-10-CM | POA: Insufficient documentation

## 2020-09-28 NOTE — Assessment & Plan Note (Signed)
She is on statin for dyslipidemia

## 2020-09-28 NOTE — Assessment & Plan Note (Signed)
-   Patient experiencing high levels of anxiety.  - Encouraged patient to engage in relaxing activities like yoga, meditation, journaling, going for a walk, or participating in a hobby.  - Encouraged patient to reach out to trusted friends or family members about recent struggles 

## 2020-09-28 NOTE — Progress Notes (Signed)
Established Patient Office Visit  Subjective:  Patient ID: Gail Mora, female    DOB: [birth year]  Age: 86 y.o. MRN: [id number]  CC:  Chief Complaint  Patient presents with  . Anxiety    patient needs refill of xanax   . Hypertension     Derrill Kay presents foranxiety  Past Medical History:  Diagnosis Date  . GERD (gastroesophageal reflux disease)   . Hyperlipemia   . Hypertension   . Hypothyroid     Past Surgical History:  Procedure Laterality Date  . ABDOMINAL HYSTERECTOMY    . BACK SURGERY    . CATARACT EXTRACTION    . Knee replacemt  2015   Left knee  . RETINAL TEAR REPAIR CRYOTHERAPY      Family History  Problem Relation Age of Onset  . Bladder Cancer Neg Hx   . Kidney cancer Neg Hx     Social History   Socioeconomic History  . Marital status: Married    Spouse name: Not on file  . Number of children: Not on file  . Years of education: Not on file  . Highest education level: Not on file  Occupational History  . Not on file  Tobacco Use  . Smoking status: Former Games developer  . Smokeless tobacco: Never Used  Substance and Sexual Activity  . Alcohol use: No  . Drug use: No  . Sexual activity: Not on file  Other Topics Concern  . Not on file  Social History Narrative  . Not on file   Social Determinants of Health   Financial Resource Strain:   . Difficulty of Paying Living Expenses: Not on file  Food Insecurity:   . Worried About Programme researcher, broadcasting/film/video in the Last Year: Not on file  . Ran Out of Food in the Last Year: Not on file  Transportation Needs:   . Lack of Transportation (Medical): Not on file  . Lack of Transportation (Non-Medical): Not on file  Physical Activity:   . Days of Exercise per Week: Not on file  . Minutes of Exercise per Session: Not on file  Stress:   . Feeling of Stress : Not on file  Social Connections:   . Frequency of Communication with Friends and Family: Not on file  . Frequency of Social Gatherings with Friends  and Family: Not on file  . Attends Religious Services: Not on file  . Active Member of Clubs or Organizations: Not on file  . Attends Banker Meetings: Not on file  . Marital Status: Not on file  Intimate Partner Violence:   . Fear of Current or Ex-Partner: Not on file  . Emotionally Abused: Not on file  . Physically Abused: Not on file  . Sexually Abused: Not on file     Current Outpatient Medications:  .  allopurinol (ZYLOPRIM) 100 MG tablet, Take 1 tablet by mouth once daily, Disp: 90 tablet, Rfl: 0 .  ALPRAZolam (XANAX) 0.25 MG tablet, Take 1 tablet (0.25 mg total) by mouth 2 (two) times daily., Disp: 60 tablet, Rfl: 0 .  Alum & Mag Hydroxide-Simeth (GI COCKTAIL) SUSP suspension, Take 30 mLs by mouth 2 (two) times daily as needed for indigestion (abd pain). Shake well. Each dose to containe 7mL maalox and 72mL viscous lidocaine and 21mL donnatal, Disp: 300 mL, Rfl: 0 .  amLODipine (NORVASC) 5 MG tablet, Take 1 tablet by mouth once daily, Disp: 90 tablet, Rfl: 0 .  aspirin EC 81 MG tablet,  Take 81 mg by mouth daily. , Disp: , Rfl:  .  BREO ELLIPTA 200-25 MCG/INH AEPB, Inhale 1 puff by mouth once daily, Disp: 60 each, Rfl: 1 .  gabapentin (NEURONTIN) 100 MG capsule, Take 1 capsule (100 mg total) by mouth 3 (three) times daily., Disp: 90 capsule, Rfl: 3 .  levothyroxine (SYNTHROID) 100 MCG tablet, Take 1 tablet (100 mcg total) by mouth daily before breakfast., Disp: 90 tablet, Rfl: 3 .  lisinopril-hydrochlorothiazide (ZESTORETIC) 20-25 MG tablet, Take 1 tablet by mouth daily., Disp: 90 tablet, Rfl: 3 .  simvastatin (ZOCOR) 40 MG tablet, Take 1 tablet (40 mg total) by mouth at bedtime., Disp: 90 tablet, Rfl: 0   Allergies  Allergen Reactions  . Codeine Nausea Only    ROS Review of Systems  Constitutional: Negative.   HENT: Negative.   Eyes: Negative.   Respiratory: Negative.   Cardiovascular: Negative.   Gastrointestinal: Negative.   Endocrine: Negative.   Skin:  Negative.   Allergic/Immunologic: Negative.   Neurological: Negative for dizziness.  Psychiatric/Behavioral: Negative.  Negative for confusion and decreased concentration.  All other systems reviewed and are negative.     Objective:    Physical Exam Vitals reviewed.  Constitutional:      Appearance: Normal appearance.  HENT:     Mouth/Throat:     Mouth: Mucous membranes are moist.  Eyes:     Pupils: Pupils are equal, round, and reactive to light.  Neck:     Vascular: No carotid bruit.  Cardiovascular:     Rate and Rhythm: Normal rate and regular rhythm.     Pulses: Normal pulses.     Heart sounds: Normal heart sounds.  Pulmonary:     Effort: Pulmonary effort is normal.     Breath sounds: Normal breath sounds.  Abdominal:     General: Bowel sounds are normal.     Palpations: Abdomen is soft. There is no hepatomegaly, splenomegaly or mass.     Tenderness: There is no abdominal tenderness.     Hernia: No hernia is present.  Musculoskeletal:        General: No tenderness.     Cervical back: Neck supple.     Right lower leg: No edema.     Left lower leg: No edema.  Skin:    Findings: No rash.  Neurological:     Mental Status: She is alert and oriented to person, place, and time.     Motor: No weakness.  Psychiatric:        Mood and Affect: Mood and affect normal.        Behavior: Behavior normal.     BP (!) 152/84   Pulse 82   Ht 5\' 5"  (1.651 m)   Wt 142 lb (64.4 kg)   BMI 23.63 kg/m  Wt Readings from Last 3 Encounters:  09/22/20 142 lb (64.4 kg)  08/19/20 140 lb 11.2 oz (63.8 kg)  07/02/20 145 lb (65.8 kg)     Health Maintenance Due  Topic Date Due  . COVID-19 Vaccine (1) Never done  . TETANUS/TDAP  Never done  . DEXA SCAN  Never done  . PNA vac Low Risk Adult (1 of 2 - PCV13) Never done    There are no preventive care reminders to display for this patient.  No results found for: TSH Lab Results  Component Value Date   WBC 7.3 05/04/2018   HGB  12.6 05/04/2018   HCT 35.4 05/04/2018   MCV 96.2 05/04/2018  PLT 222 05/04/2018   Lab Results  Component Value Date   NA 136 04/03/2020   K 4.8 04/03/2020   CO2 24 04/03/2020   GLUCOSE 88 04/03/2020   BUN 21 04/03/2020   CREATININE 1.32 (H) 04/03/2020   BILITOT 0.7 04/03/2020   ALKPHOS 44 11/30/2015   AST 18 04/03/2020   ALT 10 04/03/2020   PROT 6.9 04/03/2020   ALBUMIN 4.1 11/30/2015   CALCIUM 10.4 04/03/2020   ANIONGAP 6 05/04/2018   No results found for: CHOL No results found for: HDL No results found for: LDLCALC No results found for: TRIG No results found for: CHOLHDL No results found for: ZOXW9U    Assessment & Plan:   Problem List Items Addressed This Visit      Cardiovascular and Mediastinum   Essential hypertension    Blood pressure is borderline elevated I told the patient to go on a DASH diet.  And walk daily.  She is taking her amlodipine and lisinopril on a regular basis.        Other   Anxiety disorder    - Patient experiencing high levels of anxiety.  - Encouraged patient to engage in relaxing activities like yoga, meditation, journaling, going for a walk, or participating in a hobby.  - Encouraged patient to reach out to trusted friends or family members about recent struggles       Relevant Medications   ALPRAZolam (XANAX) 0.25 MG tablet   Hyperlipidemia    She is on statin for dyslipidemia      Need for influenza vaccination - Primary    Patient was vaccinated against flu.      Relevant Orders   Flu Vaccine QUAD High Dose(Fluad) (Completed)      Meds ordered this encounter  Medications  . ALPRAZolam (XANAX) 0.25 MG tablet    Sig: Take 1 tablet (0.25 mg total) by mouth 2 (two) times daily.    Dispense:  60 tablet    Refill:  0    Follow-up: No follow-ups on file.    Corky Downs, MD

## 2020-09-28 NOTE — Assessment & Plan Note (Signed)
Blood pressure is borderline elevated I told the patient to go on a DASH diet.  And walk daily.  She is taking her amlodipine and lisinopril on a regular basis.

## 2020-09-28 NOTE — Assessment & Plan Note (Signed)
Patient was vaccinated against flu 

## 2020-10-05 ENCOUNTER — Other Ambulatory Visit: Payer: Self-pay | Admitting: Internal Medicine

## 2020-10-05 DIAGNOSIS — R5383 Other fatigue: Secondary | ICD-10-CM

## 2020-10-05 DIAGNOSIS — R5381 Other malaise: Secondary | ICD-10-CM

## 2020-11-03 ENCOUNTER — Ambulatory Visit: Payer: Medicare Other | Admitting: Internal Medicine

## 2020-11-03 ENCOUNTER — Other Ambulatory Visit: Payer: Self-pay

## 2020-11-03 DIAGNOSIS — F419 Anxiety disorder, unspecified: Secondary | ICD-10-CM

## 2020-11-03 MED ORDER — ALPRAZOLAM 0.25 MG PO TABS: 0.2500 mg | ORAL_TABLET | Freq: Two times a day (BID) | ORAL | 0 refills | Status: DC

## 2020-11-20 ENCOUNTER — Other Ambulatory Visit: Payer: Self-pay | Admitting: Internal Medicine

## 2020-11-20 DIAGNOSIS — R5381 Other malaise: Secondary | ICD-10-CM

## 2020-11-23 ENCOUNTER — Other Ambulatory Visit: Payer: Self-pay | Admitting: Internal Medicine

## 2020-11-23 DIAGNOSIS — R5381 Other malaise: Secondary | ICD-10-CM

## 2020-11-23 DIAGNOSIS — R5383 Other fatigue: Secondary | ICD-10-CM

## 2020-12-08 ENCOUNTER — Encounter: Payer: Self-pay | Admitting: Internal Medicine

## 2020-12-08 ENCOUNTER — Ambulatory Visit (INDEPENDENT_AMBULATORY_CARE_PROVIDER_SITE_OTHER): Payer: Medicare Other | Admitting: Internal Medicine

## 2020-12-08 ENCOUNTER — Other Ambulatory Visit: Payer: Self-pay | Admitting: Internal Medicine

## 2020-12-08 DIAGNOSIS — E785 Hyperlipidemia, unspecified: Secondary | ICD-10-CM | POA: Diagnosis not present

## 2020-12-08 DIAGNOSIS — M25511 Pain in right shoulder: Secondary | ICD-10-CM | POA: Diagnosis not present

## 2020-12-08 DIAGNOSIS — I10 Essential (primary) hypertension: Secondary | ICD-10-CM

## 2020-12-08 DIAGNOSIS — F419 Anxiety disorder, unspecified: Secondary | ICD-10-CM

## 2020-12-08 MED ORDER — ALPRAZOLAM 0.25 MG PO TABS: 0.2500 mg | ORAL_TABLET | Freq: Two times a day (BID) | ORAL | 0 refills | Status: DC

## 2020-12-08 NOTE — Assessment & Plan Note (Signed)
stable °

## 2020-12-08 NOTE — Assessment & Plan Note (Signed)
.  -   I encouraged the patient to eat a low-sodium diet to help control blood pressure. - I encouraged the patient to live an active lifestyle and complete activities that increases heart rate to 85% target heart rate at least 5 times per week for one hour.     

## 2020-12-08 NOTE — Progress Notes (Signed)
Established Patient Office Visit  Subjective:  Patient ID: Gail Mora, female    DOB: September 20, 1933  Age: 87 y.o. MRN: [id number]  CC:  Chief Complaint  Patient presents with  . Anxiety    Patient is here for her monthly visit for medication refill    HPI  Gail Mora presents for for general checkup.  She denies any chest pain or shortness of breath.  She was driving his car by herself and living alone after the demise of her husband.  Her blood pressure is stable on the present medication.  She takes her medicine regularly.  She denies any swelling of the legs no chest pain or claudication, she denies any back pain or knee pain. Past Medical History:  Diagnosis Date  . GERD (gastroesophageal reflux disease)   . Hyperlipemia   . Hypertension   . Hypothyroid     Past Surgical History:  Procedure Laterality Date  . ABDOMINAL HYSTERECTOMY    . BACK SURGERY    . CATARACT EXTRACTION    . Knee replacemt  2015   Left knee  . RETINAL TEAR REPAIR CRYOTHERAPY      Family History  Problem Relation Age of Onset  . Bladder Cancer Neg Hx   . Kidney cancer Neg Hx     Social History   Socioeconomic History  . Marital status: Married    Spouse name: Not on file  . Number of children: Not on file  . Years of education: Not on file  . Highest education level: Not on file  Occupational History  . Not on file  Tobacco Use  . Smoking status: Former Games developer  . Smokeless tobacco: Never Used  Substance and Sexual Activity  . Alcohol use: No  . Drug use: No  . Sexual activity: Not on file  Other Topics Concern  . Not on file  Social History Narrative  . Not on file   Social Determinants of Health   Financial Resource Strain: Not on file  Food Insecurity: Not on file  Transportation Needs: Not on file  Physical Activity: Not on file  Stress: Not on file  Social Connections: Not on file  Intimate Partner Violence: Not on file     Current Outpatient Medications:  .   allopurinol (ZYLOPRIM) 100 MG tablet, Take 1 tablet by mouth once daily, Disp: 90 tablet, Rfl: 0 .  ALPRAZolam (XANAX) 0.25 MG tablet, Take 1 tablet (0.25 mg total) by mouth 2 (two) times daily., Disp: 60 tablet, Rfl: 0 .  Alum & Mag Hydroxide-Simeth (GI COCKTAIL) SUSP suspension, Take 30 mLs by mouth 2 (two) times daily as needed for indigestion (abd pain). Shake well. Each dose to containe 25mL maalox and 42mL viscous lidocaine and 63mL donnatal, Disp: 300 mL, Rfl: 0 .  amLODipine (NORVASC) 5 MG tablet, Take 1 tablet by mouth once daily, Disp: 90 tablet, Rfl: 0 .  aspirin EC 81 MG tablet, Take 81 mg by mouth daily. , Disp: , Rfl:  .  BREO ELLIPTA 200-25 MCG/INH AEPB, Inhale 1 puff by mouth once daily, Disp: 60 each, Rfl: 5 .  gabapentin (NEURONTIN) 100 MG capsule, Take 1 capsule (100 mg total) by mouth 3 (three) times daily., Disp: 90 capsule, Rfl: 3 .  levothyroxine (SYNTHROID) 100 MCG tablet, Take 1 tablet (100 mcg total) by mouth daily before breakfast., Disp: 90 tablet, Rfl: 3 .  lisinopril-hydrochlorothiazide (ZESTORETIC) 20-25 MG tablet, Take 1 tablet by mouth daily., Disp: 90 tablet, Rfl: 3 .  omeprazole (PRILOSEC) 40 MG capsule, Take 1 capsule by mouth once daily, Disp: 90 capsule, Rfl: 0 .  simvastatin (ZOCOR) 40 MG tablet, Take 1 tablet (40 mg total) by mouth at bedtime., Disp: 90 tablet, Rfl: 0   Allergies  Allergen Reactions  . Codeine Nausea Only    ROS Review of Systems  Constitutional: Negative.   HENT: Negative.   Eyes: Negative.   Respiratory: Negative.   Cardiovascular: Negative.   Gastrointestinal: Negative.   Endocrine: Negative.   Genitourinary: Negative.   Musculoskeletal: Negative.   Skin: Negative.   Allergic/Immunologic: Negative.   Neurological: Negative.  Negative for speech difficulty, light-headedness and headaches.  Hematological: Negative.   Psychiatric/Behavioral: Negative for agitation, confusion, sleep disturbance and suicidal ideas. The patient is  nervous/anxious.   All other systems reviewed and are negative.     Objective:    Physical Exam Vitals reviewed.  Constitutional:      Appearance: Normal appearance.  HENT:     Mouth/Throat:     Mouth: Mucous membranes are moist.  Eyes:     Pupils: Pupils are equal, round, and reactive to light.  Neck:     Vascular: No carotid bruit.  Cardiovascular:     Rate and Rhythm: Normal rate and regular rhythm.     Pulses: Normal pulses.     Heart sounds: Normal heart sounds.  Pulmonary:     Effort: Pulmonary effort is normal.     Breath sounds: Normal breath sounds.  Abdominal:     General: Bowel sounds are normal.     Palpations: Abdomen is soft. There is no hepatomegaly, splenomegaly or mass.     Tenderness: There is no abdominal tenderness.     Hernia: No hernia is present.  Musculoskeletal:        General: No tenderness.     Cervical back: Neck supple.     Right lower leg: No edema.     Left lower leg: No edema.  Skin:    Findings: No rash.  Neurological:     Mental Status: She is alert and oriented to person, place, and time.     Motor: No weakness.  Psychiatric:        Mood and Affect: Mood and affect normal.        Behavior: Behavior normal.     BP (!) 144/82   Pulse 68   Ht 5\' 2"  (1.575 m)   Wt 132 lb 12.8 oz (60.2 kg)   BMI 24.29 kg/m  Wt Readings from Last 3 Encounters:  12/08/20 132 lb 12.8 oz (60.2 kg)  09/22/20 142 lb (64.4 kg)  08/19/20 140 lb 11.2 oz (63.8 kg)     Health Maintenance Due  Topic Date Due  . COVID-19 Vaccine (1) Never done  . TETANUS/TDAP  Never done  . DEXA SCAN  Never done  . PNA vac Low Risk Adult (1 of 2 - PCV13) Never done    There are no preventive care reminders to display for this patient.  No results found for: TSH Lab Results  Component Value Date   WBC 7.3 05/04/2018   HGB 12.6 05/04/2018   HCT 35.4 05/04/2018   MCV 96.2 05/04/2018   PLT 222 05/04/2018   Lab Results  Component Value Date   NA 136  04/03/2020   K 4.8 04/03/2020   CO2 24 04/03/2020   GLUCOSE 88 04/03/2020   BUN 21 04/03/2020   CREATININE 1.32 (H) 04/03/2020   BILITOT 0.7 04/03/2020  ALKPHOS 44 11/30/2015   AST 18 04/03/2020   ALT 10 04/03/2020   PROT 6.9 04/03/2020   ALBUMIN 4.1 11/30/2015   CALCIUM 10.4 04/03/2020   ANIONGAP 6 05/04/2018  Patient is borderline anemic creatinine is slightly elevated No results found for: CHOL No results found for: HDL No results found for: LDLCALC No results found for: TRIG No results found for: CHOLHDL No results found for: IOXB3Z    Assessment & Plan:   Problem List Items Addressed This Visit      Cardiovascular and Mediastinum   Essential hypertension       - I encouraged the patient to eat a low-sodium diet to help control blood pressure. - I encouraged the patient to live an active lifestyle and complete activities that increases heart rate to 85% target heart rate at least 5 times per week for one hour.            Other   Anxiety disorder    - Patient experiencing high levels of anxiety.  - Encouraged patient to engage in relaxing activities like yoga, meditation, journaling, going for a walk, or participating in a hobby.  - Encouraged patient to reach out to trusted friends or family members about recent struggles       Relevant Medications   ALPRAZolam (XANAX) 0.25 MG tablet   Right shoulder pain    stable      Hyperlipidemia    - The patient's hyperlipidemia is stable on present  med. - The patient will continue the current treatment regimen.  - I encouraged the patient to eat more vegetables and whole wheat, and to avoid fatty foods like whole milk, hard cheese, egg yolks, margarine, baked sweets, and fried foods.  - I encouraged the patient to live an active lifestyle and complete activities for 40 minutes at least three times per week.  - I instructed the patient to go to the ER if they begin having chest pain.           Meds ordered  this encounter  Medications  . ALPRAZolam (XANAX) 0.25 MG tablet    Sig: Take 1 tablet (0.25 mg total) by mouth 2 (two) times daily.    Dispense:  60 tablet    Refill:  0    Follow-up: No follow-ups on file.    Corky Downs, MD

## 2020-12-08 NOTE — Assessment & Plan Note (Signed)
-   Patient experiencing high levels of anxiety.  - Encouraged patient to engage in relaxing activities like yoga, meditation, journaling, going for a walk, or participating in a hobby.  - Encouraged patient to reach out to trusted friends or family members about recent struggles 

## 2020-12-08 NOTE — Assessment & Plan Note (Signed)
-   The patient's hyperlipidemia is stable on present  med. - The patient will continue the current treatment regimen.  - I encouraged the patient to eat more vegetables and whole wheat, and to avoid fatty foods like whole milk, hard cheese, egg yolks, margarine, baked sweets, and fried foods.  - I encouraged the patient to live an active lifestyle and complete activities for 40 minutes at least three times per week.  - I instructed the patient to go to the ER if they begin having chest pain.   

## 2021-02-03 ENCOUNTER — Ambulatory Visit (INDEPENDENT_AMBULATORY_CARE_PROVIDER_SITE_OTHER): Payer: Medicare Other | Admitting: Internal Medicine

## 2021-02-03 ENCOUNTER — Other Ambulatory Visit: Payer: Self-pay

## 2021-02-03 ENCOUNTER — Encounter: Payer: Self-pay | Admitting: Internal Medicine

## 2021-02-03 VITALS — BP 138/80 | HR 65 | Ht 62.0 in | Wt 130.4 lb

## 2021-02-03 DIAGNOSIS — Z23 Encounter for immunization: Secondary | ICD-10-CM

## 2021-02-03 DIAGNOSIS — F419 Anxiety disorder, unspecified: Secondary | ICD-10-CM

## 2021-02-03 DIAGNOSIS — I10 Essential (primary) hypertension: Secondary | ICD-10-CM

## 2021-02-03 DIAGNOSIS — E785 Hyperlipidemia, unspecified: Secondary | ICD-10-CM | POA: Diagnosis not present

## 2021-02-03 MED ORDER — ALPRAZOLAM 0.25 MG PO TABS: 0.2500 mg | ORAL_TABLET | Freq: Two times a day (BID) | ORAL | 0 refills | Status: DC

## 2021-02-03 NOTE — Assessment & Plan Note (Signed)
Hypercholesterolemia  I advised the patient to follow Mediterranean diet This diet is rich in fruits vegetables and whole grain, and This diet is also rich in fish and lean meat Patient should also eat a handful of almonds or walnuts daily Recent heart study indicated that average follow-up on this kind of diet reduces the cardiovascular mortality by 50 to 70%== 

## 2021-02-03 NOTE — Assessment & Plan Note (Signed)

## 2021-02-03 NOTE — Assessment & Plan Note (Signed)
-   Patient experiencing high levels of anxiety.  - Encouraged patient to engage in relaxing activities like yoga, meditation, journaling, going for a walk, or participating in a hobby.  - Encouraged patient to reach out to trusted friends or family members about recent struggles 

## 2021-02-03 NOTE — Progress Notes (Signed)
Established Patient Office Visit  Subjective:  Patient ID: Gail Mora, female    DOB: Jan 14, 1933  Age: 87 y.o. MRN: [id number]  CC:  Chief Complaint  Patient presents with  . Anxiety    Patient here for refill of xanax     HPI  Gail Mora presents for general check  Past Medical History:  Diagnosis Date  . GERD (gastroesophageal reflux disease)   . Hyperlipemia   . Hypertension   . Hypothyroid     Past Surgical History:  Procedure Laterality Date  . ABDOMINAL HYSTERECTOMY    . BACK SURGERY    . CATARACT EXTRACTION    . Knee replacemt  2015   Left knee  . RETINAL TEAR REPAIR CRYOTHERAPY      Family History  Problem Relation Age of Onset  . Bladder Cancer Neg Hx   . Kidney cancer Neg Hx     Social History   Socioeconomic History  . Marital status: Married    Spouse name: Not on file  . Number of children: Not on file  . Years of education: Not on file  . Highest education level: Not on file  Occupational History  . Not on file  Tobacco Use  . Smoking status: Former Games developer  . Smokeless tobacco: Never Used  Substance and Sexual Activity  . Alcohol use: No  . Drug use: No  . Sexual activity: Not on file  Other Topics Concern  . Not on file  Social History Narrative  . Not on file   Social Determinants of Health   Financial Resource Strain: Not on file  Food Insecurity: Not on file  Transportation Needs: Not on file  Physical Activity: Not on file  Stress: Not on file  Social Connections: Not on file  Intimate Partner Violence: Not on file     Current Outpatient Medications:  .  allopurinol (ZYLOPRIM) 100 MG tablet, Take 1 tablet by mouth once daily, Disp: 90 tablet, Rfl: 0 .  Alum & Mag Hydroxide-Simeth (GI COCKTAIL) SUSP suspension, Take 30 mLs by mouth 2 (two) times daily as needed for indigestion (abd pain). Shake well. Each dose to containe 13mL maalox and 12mL viscous lidocaine and 56mL donnatal, Disp: 300 mL, Rfl: 0 .  amLODipine  (NORVASC) 5 MG tablet, Take 1 tablet by mouth once daily, Disp: 90 tablet, Rfl: 0 .  aspirin EC 81 MG tablet, Take 81 mg by mouth daily. , Disp: , Rfl:  .  BREO ELLIPTA 200-25 MCG/INH AEPB, Inhale 1 puff by mouth once daily, Disp: 60 each, Rfl: 5 .  gabapentin (NEURONTIN) 100 MG capsule, Take 1 capsule (100 mg total) by mouth 3 (three) times daily., Disp: 90 capsule, Rfl: 3 .  levothyroxine (SYNTHROID) 100 MCG tablet, Take 1 tablet (100 mcg total) by mouth daily before breakfast., Disp: 90 tablet, Rfl: 3 .  lisinopril-hydrochlorothiazide (ZESTORETIC) 20-25 MG tablet, Take 1 tablet by mouth daily., Disp: 90 tablet, Rfl: 3 .  omeprazole (PRILOSEC) 40 MG capsule, Take 1 capsule by mouth once daily, Disp: 90 capsule, Rfl: 0 .  simvastatin (ZOCOR) 40 MG tablet, Take 1 tablet (40 mg total) by mouth at bedtime., Disp: 90 tablet, Rfl: 0 .  ALPRAZolam (XANAX) 0.25 MG tablet, Take 1 tablet (0.25 mg total) by mouth 2 (two) times daily., Disp: 60 tablet, Rfl: 0   Allergies  Allergen Reactions  . Codeine Nausea Only    ROS Review of Systems  Constitutional: Negative.   HENT: Negative.  Eyes: Negative.   Respiratory: Negative.   Cardiovascular: Negative.   Gastrointestinal: Negative.   Endocrine: Negative.   Genitourinary: Negative.   Musculoskeletal: Negative.   Skin: Negative.   Allergic/Immunologic: Negative.   Neurological: Negative.   Hematological: Negative.   Psychiatric/Behavioral: Negative.   All other systems reviewed and are negative.     Objective:    Physical Exam Vitals reviewed.  Constitutional:      Appearance: Normal appearance.  HENT:     Mouth/Throat:     Mouth: Mucous membranes are moist.  Eyes:     Pupils: Pupils are equal, round, and reactive to light.  Neck:     Vascular: No carotid bruit.  Cardiovascular:     Rate and Rhythm: Normal rate and regular rhythm.     Pulses: Normal pulses.     Heart sounds: Normal heart sounds.  Pulmonary:     Effort:  Pulmonary effort is normal.     Breath sounds: Normal breath sounds.  Abdominal:     General: Bowel sounds are normal.     Palpations: Abdomen is soft. There is no hepatomegaly, splenomegaly or mass.     Tenderness: There is no abdominal tenderness.     Hernia: No hernia is present.  Musculoskeletal:        General: No tenderness.     Cervical back: Neck supple.     Right lower leg: No edema.     Left lower leg: No edema.  Skin:    Findings: No rash.  Neurological:     Mental Status: She is alert and oriented to person, place, and time.     Motor: No weakness.  Psychiatric:        Mood and Affect: Mood and affect normal.        Behavior: Behavior normal.     BP 138/80   Pulse 65   Ht 5\' 2"  (1.575 m)   Wt 130 lb 6.4 oz (59.1 kg)   BMI 23.85 kg/m  Wt Readings from Last 3 Encounters:  02/03/21 130 lb 6.4 oz (59.1 kg)  12/08/20 132 lb 12.8 oz (60.2 kg)  09/22/20 142 lb (64.4 kg)     Health Maintenance Due  Topic Date Due  . COVID-19 Vaccine (1) Never done  . TETANUS/TDAP  Never done  . DEXA SCAN  Never done  . PNA vac Low Risk Adult (1 of 2 - PCV13) Never done    There are no preventive care reminders to display for this patient.  No results found for: TSH Lab Results  Component Value Date   WBC 7.3 05/04/2018   HGB 12.6 05/04/2018   HCT 35.4 05/04/2018   MCV 96.2 05/04/2018   PLT 222 05/04/2018   Lab Results  Component Value Date   NA 136 04/03/2020   K 4.8 04/03/2020   CO2 24 04/03/2020   GLUCOSE 88 04/03/2020   BUN 21 04/03/2020   CREATININE 1.32 (H) 04/03/2020   BILITOT 0.7 04/03/2020   ALKPHOS 44 11/30/2015   AST 18 04/03/2020   ALT 10 04/03/2020   PROT 6.9 04/03/2020   ALBUMIN 4.1 11/30/2015   CALCIUM 10.4 04/03/2020   ANIONGAP 6 05/04/2018   No results found for: CHOL No results found for: HDL No results found for: LDLCALC No results found for: TRIG No results found for: CHOLHDL No results found for: 05/06/2018    Assessment & Plan:    Problem List Items Addressed This Visit      Cardiovascular and  Mediastinum   Essential hypertension - Primary    Patient blood pressure is normal patient denies any chest pain or shortness of breath there is no history of palpitation paroxysmal nocturnal dyspnea patient can walkioo yards without any problem patient was advised to follow low-salt low-cholesterol diet  I reviewed the results of Sprint trial  ideally I want to keep systolic blood pressure below 761 mmHg, patient was asked to check blood pressure 3 times a week and give me a report on that.  Patient will be follow-up in 3 months, patient will call me back for any change in the cardiovascular symptoms           Other   Anxiety disorder    - Patient experiencing high levels of anxiety.  - Encouraged patient to engage in relaxing activities like yoga, meditation, journaling, going for a walk, or participating in a hobby.  - Encouraged patient to reach out to trusted friends or family members about recent struggles      Relevant Medications   ALPRAZolam (XANAX) 0.25 MG tablet   Hyperlipidemia    Hypercholesterolemia  I advised the patient to follow Mediterranean diet This diet is rich in fruits vegetables and whole grain, and This diet is also rich in fish and lean meat Patient should also eat a handful of almonds or walnuts daily Recent heart study indicated that average follow-up on this kind of diet reduces the cardiovascular mortality by 50 to 70%==      Need for influenza vaccination    Patient was advised her flu shot.         Meds ordered this encounter  Medications  . ALPRAZolam (XANAX) 0.25 MG tablet    Sig: Take 1 tablet (0.25 mg total) by mouth 2 (two) times daily.    Dispense:  60 tablet    Refill:  0    Follow-up: No follow-ups on file.    Corky Downs, MD

## 2021-02-03 NOTE — Assessment & Plan Note (Signed)
Patient was advised her flu shot.

## 2021-02-13 ENCOUNTER — Other Ambulatory Visit: Payer: Self-pay | Admitting: Internal Medicine

## 2021-03-17 ENCOUNTER — Encounter: Payer: Self-pay | Admitting: Internal Medicine

## 2021-03-17 ENCOUNTER — Other Ambulatory Visit: Payer: Self-pay

## 2021-03-17 ENCOUNTER — Ambulatory Visit (INDEPENDENT_AMBULATORY_CARE_PROVIDER_SITE_OTHER): Payer: Medicare Other | Admitting: Internal Medicine

## 2021-03-17 VITALS — BP 162/69 | HR 73 | Ht 62.0 in | Wt 130.9 lb

## 2021-03-17 DIAGNOSIS — F419 Anxiety disorder, unspecified: Secondary | ICD-10-CM | POA: Diagnosis not present

## 2021-03-17 DIAGNOSIS — E785 Hyperlipidemia, unspecified: Secondary | ICD-10-CM | POA: Diagnosis not present

## 2021-03-17 DIAGNOSIS — I10 Essential (primary) hypertension: Secondary | ICD-10-CM | POA: Diagnosis not present

## 2021-03-17 DIAGNOSIS — M25511 Pain in right shoulder: Secondary | ICD-10-CM

## 2021-03-17 MED ORDER — OMEPRAZOLE 40 MG PO CPDR: 1.0000 | DELAYED_RELEASE_CAPSULE | Freq: Every day | ORAL | 3 refills | Status: DC

## 2021-03-17 MED ORDER — ALPRAZOLAM 0.25 MG PO TABS: 0.2500 mg | ORAL_TABLET | Freq: Two times a day (BID) | ORAL | 0 refills | Status: DC

## 2021-03-17 NOTE — Assessment & Plan Note (Signed)
Patient was advised to do rotator cuff exercises

## 2021-03-17 NOTE — Assessment & Plan Note (Signed)
-   Encouraged patient to engage in relaxing activities like yoga, meditation, journaling, going for a walk, or participating in a hobby.  - Encouraged patient to reach out to trusted friends or family members about recent struggles 

## 2021-03-17 NOTE — Progress Notes (Signed)
Established Patient Office Visit  Subjective:  Patient ID: Gail Mora, female    DOB: [birth year]  Age: 87 y.o. MRN: [id number]  CC:  Chief Complaint  Patient presents with  . Anxiety    Patient needs refill of xanax     HPI  Gail Mora presents for anxiety, patient is known to have dyslipidemia hypertension with hypertensive cardiovascular disease hypothyroidism and reflux problem.  She also has anxiety disorder.  Has bilateral shoulder impingement syndrome.  She is on gabapentin and levothyroxine and lisinopril for the blood pressure.  Also take cholesterol medication for hyperlipidemia.  Past Medical History:  Diagnosis Date  . GERD (gastroesophageal reflux disease)   . Hyperlipemia   . Hypertension   . Hypothyroid     Past Surgical History:  Procedure Laterality Date  . ABDOMINAL HYSTERECTOMY    . BACK SURGERY    . CATARACT EXTRACTION    . Knee replacemt  2015   Left knee  . RETINAL TEAR REPAIR CRYOTHERAPY      Family History  Problem Relation Age of Onset  . Bladder Cancer Neg Hx   . Kidney cancer Neg Hx     Social History   Socioeconomic History  . Marital status: Married    Spouse name: Not on file  . Number of children: Not on file  . Years of education: Not on file  . Highest education level: Not on file  Occupational History  . Not on file  Tobacco Use  . Smoking status: Former Games developer  . Smokeless tobacco: Never Used  Substance and Sexual Activity  . Alcohol use: No  . Drug use: No  . Sexual activity: Not on file  Other Topics Concern  . Not on file  Social History Narrative  . Not on file   Social Determinants of Health   Financial Resource Strain: Not on file  Food Insecurity: Not on file  Transportation Needs: Not on file  Physical Activity: Not on file  Stress: Not on file  Social Connections: Not on file  Intimate Partner Violence: Not on file     Current Outpatient Medications:  .  allopurinol (ZYLOPRIM) 100 MG tablet,  Take 1 tablet by mouth once daily, Disp: 90 tablet, Rfl: 0 .  Alum & Mag Hydroxide-Simeth (GI COCKTAIL) SUSP suspension, Take 30 mLs by mouth 2 (two) times daily as needed for indigestion (abd pain). Shake well. Each dose to containe 32mL maalox and 33mL viscous lidocaine and 4mL donnatal, Disp: 300 mL, Rfl: 0 .  amLODipine (NORVASC) 5 MG tablet, Take 1 tablet by mouth once daily, Disp: 90 tablet, Rfl: 0 .  aspirin EC 81 MG tablet, Take 81 mg by mouth daily. , Disp: , Rfl:  .  BREO ELLIPTA 200-25 MCG/INH AEPB, Inhale 1 puff by mouth once daily, Disp: 60 each, Rfl: 5 .  gabapentin (NEURONTIN) 100 MG capsule, Take 1 capsule (100 mg total) by mouth 3 (three) times daily., Disp: 90 capsule, Rfl: 3 .  levothyroxine (SYNTHROID) 100 MCG tablet, Take 1 tablet (100 mcg total) by mouth daily before breakfast., Disp: 90 tablet, Rfl: 3 .  lisinopril-hydrochlorothiazide (ZESTORETIC) 20-25 MG tablet, Take 1 tablet by mouth daily., Disp: 90 tablet, Rfl: 3 .  simvastatin (ZOCOR) 40 MG tablet, Take 1 tablet (40 mg total) by mouth at bedtime., Disp: 90 tablet, Rfl: 0 .  ALPRAZolam (XANAX) 0.25 MG tablet, Take 1 tablet (0.25 mg total) by mouth 2 (two) times daily., Disp: 60 tablet, Rfl: 0 .  omeprazole (PRILOSEC) 40 MG capsule, Take 1 capsule (40 mg total) by mouth daily., Disp: 90 capsule, Rfl: 3   Allergies  Allergen Reactions  . Codeine Nausea Only    ROS Review of Systems  Constitutional: Negative.   HENT: Negative.   Eyes: Negative.   Respiratory: Negative.   Cardiovascular: Negative.   Gastrointestinal: Negative.   Endocrine: Negative.   Genitourinary: Negative.   Musculoskeletal: Negative.   Skin: Negative.   Allergic/Immunologic: Negative.   Neurological: Negative.   Hematological: Negative.   Psychiatric/Behavioral: Negative.   All other systems reviewed and are negative.     Objective:    Physical Exam Vitals reviewed.  Constitutional:      Appearance: Normal appearance.  HENT:      Mouth/Throat:     Mouth: Mucous membranes are moist.  Eyes:     Pupils: Pupils are equal, round, and reactive to light.  Neck:     Vascular: No carotid bruit.  Cardiovascular:     Rate and Rhythm: Normal rate and regular rhythm.     Pulses: Normal pulses.     Heart sounds: Normal heart sounds.  Pulmonary:     Effort: Pulmonary effort is normal.     Breath sounds: Normal breath sounds.  Abdominal:     General: Bowel sounds are normal.     Palpations: Abdomen is soft. There is no hepatomegaly, splenomegaly or mass.     Tenderness: There is no abdominal tenderness.     Hernia: No hernia is present.  Musculoskeletal:        General: No tenderness.     Cervical back: Neck supple.     Right lower leg: No edema.     Left lower leg: No edema.  Skin:    Findings: No rash.  Neurological:     Mental Status: She is alert and oriented to person, place, and time.     Motor: No weakness.  Psychiatric:        Mood and Affect: Mood and affect normal.        Behavior: Behavior normal.     BP (!) 162/69   Pulse 73   Ht 5\' 2"  (1.575 m)   Wt 130 lb 14.4 oz (59.4 kg)   BMI 23.94 kg/m  Wt Readings from Last 3 Encounters:  03/17/21 130 lb 14.4 oz (59.4 kg)  02/03/21 130 lb 6.4 oz (59.1 kg)  12/08/20 132 lb 12.8 oz (60.2 kg)     Health Maintenance Due  Topic Date Due  . COVID-19 Vaccine (1) Never done  . TETANUS/TDAP  Never done  . DEXA SCAN  Never done  . PNA vac Low Risk Adult (1 of 2 - PCV13) Never done    There are no preventive care reminders to display for this patient.  No results found for: TSH Lab Results  Component Value Date   WBC 7.3 05/04/2018   HGB 12.6 05/04/2018   HCT 35.4 05/04/2018   MCV 96.2 05/04/2018   PLT 222 05/04/2018   Lab Results  Component Value Date   NA 136 04/03/2020   K 4.8 04/03/2020   CO2 24 04/03/2020   GLUCOSE 88 04/03/2020   BUN 21 04/03/2020   CREATININE 1.32 (H) 04/03/2020   BILITOT 0.7 04/03/2020   ALKPHOS 44 11/30/2015   AST  18 04/03/2020   ALT 10 04/03/2020   PROT 6.9 04/03/2020   ALBUMIN 4.1 11/30/2015   CALCIUM 10.4 04/03/2020   ANIONGAP 6 05/04/2018   No results  found for: CHOL No results found for: HDL No results found for: LDLCALC No results found for: TRIG No results found for: CHOLHDL No results found for: XTGG2I    Assessment & Plan:   Problem List Items Addressed This Visit      Cardiovascular and Mediastinum   Essential hypertension, benign    Patient blood pressure is normal patient denies any chest pain or shortness of breath there is no history of palpitation or paroxysmal nocturnal dyspnea   patient was advised to follow low-salt low-cholesterol diet    ideally I want to keep systolic blood pressure below 948 mmHg, patient was asked to check blood pressure one times a week and give me a report on that.  Patient will be follow-up in 3 months  or earlier as needed, patient will call me back for any change in the cardiovascular symptoms           Other   Anxiety disorder    -    - Encouraged patient to engage in relaxing activities like yoga, meditation, journaling, going for a walk, or participating in a hobby.  - Encouraged patient to reach out to trusted friends or family members about recent struggles      Relevant Medications   ALPRAZolam (XANAX) 0.25 MG tablet   Right shoulder pain - Primary    Patient was advised to do rotator cuff exercises      Hyperlipidemia    Hypercholesterolemia  I advised the patient to follow Mediterranean diet This diet is rich in fruits vegetables and whole grain, and This diet is also rich in fish and lean meat Patient should also eat a handful of almonds or walnuts daily Recent heart study indicated that average follow-up on this kind of diet reduces the cardiovascular mortality by 50 to 70%==         Meds ordered this encounter  Medications  . ALPRAZolam (XANAX) 0.25 MG tablet    Sig: Take 1 tablet (0.25 mg total) by mouth 2 (two)  times daily.    Dispense:  60 tablet    Refill:  0  . omeprazole (PRILOSEC) 40 MG capsule    Sig: Take 1 capsule (40 mg total) by mouth daily.    Dispense:  90 capsule    Refill:  3    Follow-up: No follow-ups on file.    Corky Downs, MD

## 2021-03-17 NOTE — Assessment & Plan Note (Signed)
Hypercholesterolemia  I advised the patient to follow Mediterranean diet This diet is rich in fruits vegetables and whole grain, and This diet is also rich in fish and lean meat Patient should also eat a handful of almonds or walnuts daily Recent heart study indicated that average follow-up on this kind of diet reduces the cardiovascular mortality by 50 to 70%== 

## 2021-03-17 NOTE — Progress Notes (Deleted)
History of Present Illness   Patient Identification Gail Mora is a 87 y.o. female.  Subjective:    Gail Mora is a 87 y.o. female who presents for evaluation of chest pain. Onset was {1-14:11001} {time; units:19031} ago. Symptoms have {course:19176} since that time. The patient describes the pain as {desc; chest pain:19156} and {radiation:31138}. Patient rates pain as a {1/10-10/10:10902} in intensity. Associated symptoms are: {cardiac symptoms:12860}. Aggravating factors are: {chest pain aggravating factors:31141}. Alleviating factors are: {chest pain alleviating factors:31142}. Patient's cardiac risk factors are: {risk factors:510}. Patient's risk factors for DVT/PE: {risk factors:11319}. Previous cardiac testing: {cardiac tests:11254}.   Gail Mora is a 87 y.o. female who presents for evaluation of chest pain. Onset was {1-14:11001} {time; units:19031} ago. Symptoms have {course:19176} since that time. The patient describes the pain as {desc; chest pain:19156} and {radiation:31138}. Patient rates pain as a {1/10-10/10:10902} in intensity. Associated symptoms are: {cardiac symptoms:12860}. Aggravating factors are: {chest pain aggravating factors:31141}. Alleviating factors are: {chest pain alleviating factors:31142}. Patient's cardiac risk factors are: {risk factors:510}. Patient's risk factors for DVT/PE: {risk factors:11319}. Previous cardiac testing: {cardiac tests:11254}.  {Common ambulatory SmartLinks:19316}  Review of Systems {ROS - complete:30496}    Objective:    {Exam, Complete:17964}  Cardiographics ECG: {diagnostic ecg:31282}  Imaging Chest x-ray: {cxr findings:5343::"normal chest x-ray"}    Assessment:    Chest pain, suspected etiology: {chest pain etiology:30966}    Plan:    {ZTIW:58099}   Chief Complaint  No chief complaint on file.   The patient complains of {cardiac complaints:12860}. The discomfort is described as {chest pain description:5016} with  radiation to the {ed chest pain radiation:11146}. Onset of symptoms was {onset:60104}, {course:15616} course since that time. Each episode lasts for {numbers 1-14:11001} {time units:11}. The episodes are brought on by {ed chest pain provocation:11147} . The patient also complains of {ed chest pain associated:11148}. The patient denies {ed chest pain associated:11148}. Patient's cardiac risk factors are {cv risk factors:510}. The patient denies risk factors of {cv risk factors:510}.  Care prior to arrival consisted of {home care:60200}, with {relief:12621} relief.  Past Medical History:  Diagnosis Date  . GERD (gastroesophageal reflux disease)   . Hyperlipemia   . Hypertension   . Hypothyroid    Family History  Problem Relation Age of Onset  . Bladder Cancer Neg Hx   . Kidney cancer Neg Hx    Current Outpatient Medications  Medication Sig Dispense Refill  . allopurinol (ZYLOPRIM) 100 MG tablet Take 1 tablet by mouth once daily 90 tablet 0  . ALPRAZolam (XANAX) 0.25 MG tablet Take 1 tablet (0.25 mg total) by mouth 2 (two) times daily. 60 tablet 0  . Alum & Mag Hydroxide-Simeth (GI COCKTAIL) SUSP suspension Take 30 mLs by mouth 2 (two) times daily as needed for indigestion (abd pain). Shake well. Each dose to containe 65mL maalox and 15mL viscous lidocaine and 63mL donnatal 300 mL 0  . amLODipine (NORVASC) 5 MG tablet Take 1 tablet by mouth once daily 90 tablet 0  . aspirin EC 81 MG tablet Take 81 mg by mouth daily.     Marland Kitchen BREO ELLIPTA 200-25 MCG/INH AEPB Inhale 1 puff by mouth once daily 60 each 5  . gabapentin (NEURONTIN) 100 MG capsule Take 1 capsule (100 mg total) by mouth 3 (three) times daily. 90 capsule 3  . levothyroxine (SYNTHROID) 100 MCG tablet Take 1 tablet (100 mcg total) by mouth daily before breakfast. 90 tablet 3  . lisinopril-hydrochlorothiazide (ZESTORETIC) 20-25 MG tablet Take 1 tablet  by mouth daily. 90 tablet 3  . omeprazole (PRILOSEC) 40 MG capsule Take 1 capsule by mouth  once daily 90 capsule 0  . simvastatin (ZOCOR) 40 MG tablet Take 1 tablet (40 mg total) by mouth at bedtime. 90 tablet 0   No current facility-administered medications for this visit.   Allergies  Allergen Reactions  . Codeine Nausea Only   Social History   Socioeconomic History  . Marital status: Married    Spouse name: Not on file  . Number of children: Not on file  . Years of education: Not on file  . Highest education level: Not on file  Occupational History  . Not on file  Tobacco Use  . Smoking status: Former Games developer  . Smokeless tobacco: Never Used  Substance and Sexual Activity  . Alcohol use: No  . Drug use: No  . Sexual activity: Not on file  Other Topics Concern  . Not on file  Social History Narrative  . Not on file   Social Determinants of Health   Financial Resource Strain: Not on file  Food Insecurity: Not on file  Transportation Needs: Not on file  Physical Activity: Not on file  Stress: Not on file  Social Connections: Not on file  Intimate Partner Violence: Not on file   Review of Systems {ROS - complete:30496}   Physical Exam   There were no vitals taken for this visit. {Exam, Complete:17964}  ED Course   Studies: {Studies:60337}  Records Reviewed: {Reviewed:60251}  Treatments: {ED Card Tx list:18157}  Consultations: {Consultations:60250}  Disposition: {ED Disposition:60811}

## 2021-03-17 NOTE — Assessment & Plan Note (Signed)
Patient blood pressure is normal patient denies any chest pain or shortness of breath there is no history of palpitation or paroxysmal nocturnal dyspnea   patient was advised to follow low-salt low-cholesterol diet    ideally I want to keep systolic blood pressure below 130 mmHg, patient was asked to check blood pressure one times a week and give me a report on that.  Patient will be follow-up in 3 months  or earlier as needed, patient will call me back for any change in the cardiovascular symptoms    

## 2021-04-21 ENCOUNTER — Ambulatory Visit (INDEPENDENT_AMBULATORY_CARE_PROVIDER_SITE_OTHER): Payer: Medicare Other | Admitting: Internal Medicine

## 2021-04-21 ENCOUNTER — Other Ambulatory Visit: Payer: Self-pay

## 2021-04-21 ENCOUNTER — Encounter: Payer: Self-pay | Admitting: Internal Medicine

## 2021-04-21 ENCOUNTER — Other Ambulatory Visit: Payer: Self-pay | Admitting: *Deleted

## 2021-04-21 VITALS — BP 140/80 | HR 84 | Ht 62.0 in | Wt 130.6 lb

## 2021-04-21 DIAGNOSIS — F419 Anxiety disorder, unspecified: Secondary | ICD-10-CM | POA: Diagnosis not present

## 2021-04-21 DIAGNOSIS — I10 Essential (primary) hypertension: Secondary | ICD-10-CM | POA: Diagnosis not present

## 2021-04-21 DIAGNOSIS — M25511 Pain in right shoulder: Secondary | ICD-10-CM | POA: Diagnosis not present

## 2021-04-21 DIAGNOSIS — E785 Hyperlipidemia, unspecified: Secondary | ICD-10-CM

## 2021-04-21 MED ORDER — ALPRAZOLAM 0.25 MG PO TABS: 0.2500 mg | ORAL_TABLET | Freq: Two times a day (BID) | ORAL | 0 refills | Status: DC

## 2021-04-21 NOTE — Assessment & Plan Note (Signed)
Patient was advised to follow low-cholesterol diet 

## 2021-04-21 NOTE — Assessment & Plan Note (Signed)

## 2021-04-21 NOTE — Progress Notes (Signed)
Established Patient Office Visit  Subjective:  Patient ID: Gail Mora, female    DOB: 1933-03-01  Age: 87 y.o. MRN: [id number]  CC:  Chief Complaint  Patient presents with  . Anxiety    Needs refill of xanax     HPI  Gail Mora presents for check up  Past Medical History:  Diagnosis Date  . GERD (gastroesophageal reflux disease)   . Hyperlipemia   . Hypertension   . Hypothyroid     Past Surgical History:  Procedure Laterality Date  . ABDOMINAL HYSTERECTOMY    . BACK SURGERY    . CATARACT EXTRACTION    . Knee replacemt  2015   Left knee  . RETINAL TEAR REPAIR CRYOTHERAPY      Family History  Problem Relation Age of Onset  . Bladder Cancer Neg Hx   . Kidney cancer Neg Hx     Social History   Socioeconomic History  . Marital status: Married    Spouse name: Not on file  . Number of children: Not on file  . Years of education: Not on file  . Highest education level: Not on file  Occupational History  . Not on file  Tobacco Use  . Smoking status: Former Games developer  . Smokeless tobacco: Never Used  Substance and Sexual Activity  . Alcohol use: No  . Drug use: No  . Sexual activity: Not on file  Other Topics Concern  . Not on file  Social History Narrative  . Not on file   Social Determinants of Health   Financial Resource Strain: Not on file  Food Insecurity: Not on file  Transportation Needs: Not on file  Physical Activity: Not on file  Stress: Not on file  Social Connections: Not on file  Intimate Partner Violence: Not on file     Current Outpatient Medications:  .  allopurinol (ZYLOPRIM) 100 MG tablet, Take 1 tablet by mouth once daily, Disp: 90 tablet, Rfl: 0 .  ALPRAZolam (XANAX) 0.25 MG tablet, Take 1 tablet (0.25 mg total) by mouth 2 (two) times daily., Disp: 60 tablet, Rfl: 0 .  Alum & Mag Hydroxide-Simeth (GI COCKTAIL) SUSP suspension, Take 30 mLs by mouth 2 (two) times daily as needed for indigestion (abd pain). Shake well. Each dose  to containe 10mL maalox and 30mL viscous lidocaine and 61mL donnatal, Disp: 300 mL, Rfl: 0 .  amLODipine (NORVASC) 5 MG tablet, Take 1 tablet by mouth once daily, Disp: 90 tablet, Rfl: 0 .  aspirin EC 81 MG tablet, Take 81 mg by mouth daily. , Disp: , Rfl:  .  BREO ELLIPTA 200-25 MCG/INH AEPB, Inhale 1 puff by mouth once daily, Disp: 60 each, Rfl: 5 .  gabapentin (NEURONTIN) 100 MG capsule, Take 1 capsule (100 mg total) by mouth 3 (three) times daily., Disp: 90 capsule, Rfl: 3 .  levothyroxine (SYNTHROID) 100 MCG tablet, Take 1 tablet (100 mcg total) by mouth daily before breakfast., Disp: 90 tablet, Rfl: 3 .  lisinopril-hydrochlorothiazide (ZESTORETIC) 20-25 MG tablet, Take 1 tablet by mouth daily., Disp: 90 tablet, Rfl: 3 .  omeprazole (PRILOSEC) 40 MG capsule, Take 1 capsule (40 mg total) by mouth daily., Disp: 90 capsule, Rfl: 3 .  simvastatin (ZOCOR) 40 MG tablet, Take 1 tablet (40 mg total) by mouth at bedtime., Disp: 90 tablet, Rfl: 0   Allergies  Allergen Reactions  . Codeine Nausea Only    ROS Review of Systems  Constitutional: Negative.   HENT: Negative.  Eyes: Negative.   Respiratory: Negative.   Cardiovascular: Negative.   Gastrointestinal: Negative.   Endocrine: Negative.   Genitourinary: Negative.   Musculoskeletal: Negative.   Skin: Negative.   Allergic/Immunologic: Negative.   Neurological: Negative.   Hematological: Negative.   Psychiatric/Behavioral: Negative.   All other systems reviewed and are negative.     Objective:    Physical Exam Vitals reviewed.  Constitutional:      Appearance: Normal appearance.  HENT:     Mouth/Throat:     Mouth: Mucous membranes are moist.  Eyes:     Pupils: Pupils are equal, round, and reactive to light.  Neck:     Vascular: No carotid bruit.  Cardiovascular:     Rate and Rhythm: Normal rate and regular rhythm.     Pulses: Normal pulses.     Heart sounds: Normal heart sounds.  Pulmonary:     Effort: Pulmonary effort  is normal.     Breath sounds: Normal breath sounds.  Abdominal:     General: Bowel sounds are normal.     Palpations: Abdomen is soft. There is no hepatomegaly, splenomegaly or mass.     Tenderness: There is no abdominal tenderness.     Hernia: No hernia is present.  Musculoskeletal:        General: No tenderness.     Cervical back: Neck supple.     Right lower leg: No edema.     Left lower leg: No edema.  Skin:    Findings: No rash.  Neurological:     Mental Status: She is alert and oriented to person, place, and time.     Motor: No weakness.  Psychiatric:        Mood and Affect: Mood and affect normal.        Behavior: Behavior normal.     BP 140/80   Pulse 84   Ht 5\' 2"  (1.575 m)   Wt 130 lb 9.6 oz (59.2 kg)   BMI 23.89 kg/m  Wt Readings from Last 3 Encounters:  04/21/21 130 lb 9.6 oz (59.2 kg)  03/17/21 130 lb 14.4 oz (59.4 kg)  02/03/21 130 lb 6.4 oz (59.1 kg)     Health Maintenance Due  Topic Date Due  . COVID-19 Vaccine (1) Never done  . TETANUS/TDAP  Never done  . Zoster Vaccines- Shingrix (1 of 2) Never done  . DEXA SCAN  Never done  . PNA vac Low Risk Adult (1 of 2 - PCV13) Never done    There are no preventive care reminders to display for this patient.  No results found for: TSH Lab Results  Component Value Date   WBC 7.3 05/04/2018   HGB 12.6 05/04/2018   HCT 35.4 05/04/2018   MCV 96.2 05/04/2018   PLT 222 05/04/2018   Lab Results  Component Value Date   NA 136 04/03/2020   K 4.8 04/03/2020   CO2 24 04/03/2020   GLUCOSE 88 04/03/2020   BUN 21 04/03/2020   CREATININE 1.32 (H) 04/03/2020   BILITOT 0.7 04/03/2020   ALKPHOS 44 11/30/2015   AST 18 04/03/2020   ALT 10 04/03/2020   PROT 6.9 04/03/2020   ALBUMIN 4.1 11/30/2015   CALCIUM 10.4 04/03/2020   ANIONGAP 6 05/04/2018   No results found for: CHOL No results found for: HDL No results found for: LDLCALC No results found for: TRIG No results found for: CHOLHDL No results found  for: HGBA1C    Assessment & Plan:   Problem  List Items Addressed This Visit      Cardiovascular and Mediastinum   Essential hypertension, benign - Primary    Patient blood pressure is normal patient denies any chest pain or shortness of breath there is no history of palpitation or paroxysmal nocturnal dyspnea   patient was advised to follow low-salt low-cholesterol diet    ideally I want to keep systolic blood pressure below 570 mmHg, patient was asked to check blood pressure one times a week and give me a report on that.  Patient will be follow-up in 3 months  or earlier as needed, patient will call me back for any change in the cardiovascular symptoms Patient was advised to buy a book from local bookstore concerning blood pressure and read several chapters  every day.  This will be supplemented by some of the material we will give him from the office.  Patient should also utilize other resources like YouTube and Internet to learn more about the blood pressure and the diet.        Other   Anxiety disorder    Anxiety is much better      Right shoulder pain    We will give you a shot in the  right shoulder on the next visit      Hyperlipidemia    Patient was advised to follow low-cholesterol diet.         No orders of the defined types were placed in this encounter.   Follow-up: No follow-ups on file.    Corky Downs, MD

## 2021-04-21 NOTE — Assessment & Plan Note (Signed)
Anxiety is much better

## 2021-04-21 NOTE — Assessment & Plan Note (Signed)
We will give you a shot in the  right shoulder on the next visit

## 2021-05-21 ENCOUNTER — Other Ambulatory Visit: Payer: Self-pay | Admitting: Internal Medicine

## 2021-06-05 ENCOUNTER — Encounter: Payer: Self-pay | Admitting: Family Medicine

## 2021-06-05 ENCOUNTER — Other Ambulatory Visit: Payer: Self-pay

## 2021-06-05 ENCOUNTER — Ambulatory Visit (INDEPENDENT_AMBULATORY_CARE_PROVIDER_SITE_OTHER): Payer: Medicare Other | Admitting: Family Medicine

## 2021-06-05 VITALS — BP 156/70 | HR 58 | Temp 98.0°F | Resp 18 | Ht 62.0 in | Wt 129.0 lb

## 2021-06-05 DIAGNOSIS — F4321 Adjustment disorder with depressed mood: Secondary | ICD-10-CM | POA: Insufficient documentation

## 2021-06-05 DIAGNOSIS — F419 Anxiety disorder, unspecified: Secondary | ICD-10-CM | POA: Diagnosis not present

## 2021-06-05 MED ORDER — ALPRAZOLAM 0.25 MG PO TABS: 0.2500 mg | ORAL_TABLET | Freq: Two times a day (BID) | ORAL | 0 refills | Status: DC

## 2021-06-05 MED ORDER — DULOXETINE HCL 20 MG PO CPEP: 20.0000 mg | ORAL_CAPSULE | Freq: Every day | ORAL | 3 refills | Status: DC

## 2021-06-05 NOTE — Assessment & Plan Note (Signed)
Patient lost her husband of 44 years in December, in the last year she has lost 15 lbs unintentionally. She admits to not eating well and feeling down a lot.

## 2021-06-05 NOTE — Progress Notes (Signed)
Established Patient Office Visit  SUBJECTIVE:  Subjective  Patient ID: Gail Mora, female    DOB: 11-30-1932  Age: 87 y.o. MRN: [id number]  CC: No chief complaint on file.   HPI Gail Mora is a 87 y.o. female presenting today for     Past Medical History:  Diagnosis Date   GERD (gastroesophageal reflux disease)    Hyperlipemia    Hypertension    Hypothyroid     Past Surgical History:  Procedure Laterality Date   ABDOMINAL HYSTERECTOMY     BACK SURGERY     CATARACT EXTRACTION     Knee replacemt  2015   Left knee   RETINAL TEAR REPAIR CRYOTHERAPY      Family History  Problem Relation Age of Onset   Bladder Cancer Neg Hx    Kidney cancer Neg Hx     Social History   Socioeconomic History   Marital status: Married    Spouse name: Not on file   Number of children: Not on file   Years of education: Not on file   Highest education level: Not on file  Occupational History   Not on file  Tobacco Use   Smoking status: Former   Smokeless tobacco: Never  Substance and Sexual Activity   Alcohol use: No   Drug use: No   Sexual activity: Not on file  Other Topics Concern   Not on file  Social History Narrative   Not on file   Social Determinants of Health   Financial Resource Strain: Not on file  Food Insecurity: Not on file  Transportation Needs: Not on file  Physical Activity: Not on file  Stress: Not on file  Social Connections: Not on file  Intimate Partner Violence: Not on file     Current Outpatient Medications:    allopurinol (ZYLOPRIM) 100 MG tablet, Take 1 tablet by mouth once daily, Disp: 90 tablet, Rfl: 0   ALPRAZolam (XANAX) 0.25 MG tablet, Take 1 tablet (0.25 mg total) by mouth 2 (two) times daily., Disp: 60 tablet, Rfl: 0   Alum & Mag Hydroxide-Simeth (GI COCKTAIL) SUSP suspension, Take 30 mLs by mouth 2 (two) times daily as needed for indigestion (abd pain). Shake well. Each dose to containe 91mL maalox and 25mL viscous lidocaine and  67mL donnatal, Disp: 300 mL, Rfl: 0   amLODipine (NORVASC) 5 MG tablet, Take 1 tablet by mouth once daily, Disp: 90 tablet, Rfl: 0   aspirin EC 81 MG tablet, Take 81 mg by mouth daily. , Disp: , Rfl:    BREO ELLIPTA 200-25 MCG/INH AEPB, Inhale 1 puff by mouth once daily, Disp: 60 each, Rfl: 5   DULoxetine (CYMBALTA) 20 MG capsule, Take 1 capsule (20 mg total) by mouth daily., Disp: 30 capsule, Rfl: 3   gabapentin (NEURONTIN) 100 MG capsule, Take 1 capsule (100 mg total) by mouth 3 (three) times daily., Disp: 90 capsule, Rfl: 3   levothyroxine (SYNTHROID) 100 MCG tablet, Take 1 tablet (100 mcg total) by mouth daily before breakfast., Disp: 90 tablet, Rfl: 3   lisinopril-hydrochlorothiazide (ZESTORETIC) 20-25 MG tablet, Take 1 tablet by mouth daily., Disp: 90 tablet, Rfl: 3   omeprazole (PRILOSEC) 40 MG capsule, Take 1 capsule (40 mg total) by mouth daily., Disp: 90 capsule, Rfl: 3   simvastatin (ZOCOR) 40 MG tablet, Take 1 tablet (40 mg total) by mouth at bedtime., Disp: 90 tablet, Rfl: 0   Allergies  Allergen Reactions   Codeine Nausea Only  ROS Review of Systems  Constitutional: Negative.   HENT: Negative.    Respiratory: Negative.    Cardiovascular: Negative.   Genitourinary: Negative.   Musculoskeletal: Negative.   Skin: Negative.   Psychiatric/Behavioral:  Positive for dysphoric mood, self-injury and sleep disturbance. Negative for suicidal ideas. The patient is nervous/anxious.     OBJECTIVE:    Physical Exam HENT:     Head: Normocephalic.     Mouth/Throat:     Mouth: Mucous membranes are moist.  Cardiovascular:     Rate and Rhythm: Normal rate and regular rhythm.  Neurological:     Mental Status: She is alert.    BP (!) 156/70   Pulse (!) 58   Temp 98 F (36.7 C)   Resp 18   Ht 5\' 2"  (1.575 m)   Wt 129 lb (58.5 kg)   BMI 23.59 kg/m  Wt Readings from Last 3 Encounters:  06/05/21 129 lb (58.5 kg)  04/21/21 130 lb 9.6 oz (59.2 kg)  03/17/21 130 lb 14.4 oz (59.4  kg)    Health Maintenance Due  Topic Date Due   TETANUS/TDAP  Never done   Zoster Vaccines- Shingrix (1 of 2) Never done   DEXA SCAN  Never done   PNA vac Low Risk Adult (2 of 2 - PCV13) 10/20/2006    There are no preventive care reminders to display for this patient.  CBC Latest Ref Rng & Units 05/04/2018 06/16/2017 05/09/2016  WBC 3.6 - 11.0 K/uL 7.3 7.5 7.8  Hemoglobin 12.0 - 16.0 g/dL 05/11/2016 01.0 10.4(L)  Hematocrit 35.0 - 47.0 % 35.4 35.1 30.9(L)  Platelets 150 - 440 K/uL 222 237 242   CMP Latest Ref Rng & Units 04/03/2020 05/04/2018 06/16/2017  Glucose 65 - 99 mg/dL 88 08/16/2017) 355(D)  BUN 7 - 25 mg/dL 21 322(G) 20  Creatinine 0.60 - 0.88 mg/dL 25(K) 2.70(W) 2.37(S)  Sodium 135 - 146 mmol/L 136 137 136  Potassium 3.5 - 5.3 mmol/L 4.8 4.7 3.9  Chloride 98 - 110 mmol/L 100 105 103  CO2 20 - 32 mmol/L 24 26 26   Calcium 8.6 - 10.4 mg/dL 2.83(T 10.4(H) 10.1  Total Protein 6.1 - 8.1 g/dL 6.9 - -  Total Bilirubin 0.2 - 1.2 mg/dL 0.7 - -  Alkaline Phos 38 - 126 U/L - - -  AST 10 - 35 U/L 18 - -  ALT 6 - 29 U/L 10 - -    No results found for: TSH Lab Results  Component Value Date   ALBUMIN 4.1 11/30/2015   ANIONGAP 6 05/04/2018   No results found for: CHOL, HDL, LDLCALC, CHOLHDL No results found for: TRIG No results found for: HGBA1C    ASSESSMENT & PLAN:   Problem List Items Addressed This Visit       Other   Anxiety disorder   Relevant Medications   ALPRAZolam (XANAX) 0.25 MG tablet   DULoxetine (CYMBALTA) 20 MG capsule   Situational depression - Primary    Patient lost her husband of 44 years in December, in the last year she has lost 15 lbs unintentionally. She admits to not eating well and feeling down a lot.        Relevant Medications   ALPRAZolam (XANAX) 0.25 MG tablet   DULoxetine (CYMBALTA) 20 MG capsule    Meds ordered this encounter  Medications   DISCONTD: DULoxetine (CYMBALTA) 20 MG capsule    Sig: Take 1 capsule (20 mg total) by mouth daily.  Dispense:  30 capsule    Refill:  3   DISCONTD: ALPRAZolam (XANAX) 0.25 MG tablet    Sig: Take 1 tablet (0.25 mg total) by mouth 2 (two) times daily.    Dispense:  60 tablet    Refill:  0   ALPRAZolam (XANAX) 0.25 MG tablet    Sig: Take 1 tablet (0.25 mg total) by mouth 2 (two) times daily.    Dispense:  60 tablet    Refill:  0   DULoxetine (CYMBALTA) 20 MG capsule    Sig: Take 1 capsule (20 mg total) by mouth daily.    Dispense:  30 capsule    Refill:  3      Follow-up: No follow-ups on file.    Irish Lack, FNP [facility] [address], [place], [place] [address]

## 2021-07-07 DIAGNOSIS — H35342 Macular cyst, hole, or pseudohole, left eye: Secondary | ICD-10-CM | POA: Diagnosis not present

## 2021-07-17 ENCOUNTER — Ambulatory Visit (INDEPENDENT_AMBULATORY_CARE_PROVIDER_SITE_OTHER): Payer: Medicare Other | Admitting: *Deleted

## 2021-07-17 ENCOUNTER — Other Ambulatory Visit: Payer: Self-pay

## 2021-07-17 DIAGNOSIS — Z0000 Encounter for general adult medical examination without abnormal findings: Secondary | ICD-10-CM | POA: Diagnosis not present

## 2021-07-17 NOTE — Progress Notes (Signed)
Subjective:   Gail Mora is a 87 y.o. female who presents for Medicare Annual (Subsequent) preventive examination. Visit completed using audio. Patient :home Provider:home Review of Systems    Defer to provider Cardiac Risk Factors include: none     Objective:    Today's Vitals   07/17/21 0957  PainSc: 2    There is no height or weight on file to calculate BMI.  Advanced Directives 05/04/2018 06/16/2017 05/09/2016  Does Patient Have a Medical Advance Directive? No No No  Would patient like information on creating a medical advance directive? - - No - patient declined information    Current Medications (verified) Outpatient Encounter Medications as of 07/17/2021  Medication Sig   allopurinol (ZYLOPRIM) 100 MG tablet Take 1 tablet by mouth once daily   ALPRAZolam (XANAX) 0.25 MG tablet Take 1 tablet (0.25 mg total) by mouth 2 (two) times daily.   Alum & Mag Hydroxide-Simeth (GI COCKTAIL) SUSP suspension Take 30 mLs by mouth 2 (two) times daily as needed for indigestion (abd pain). Shake well. Each dose to containe 59mL maalox and 78mL viscous lidocaine and 51mL donnatal   amLODipine (NORVASC) 5 MG tablet Take 1 tablet by mouth once daily   aspirin EC 81 MG tablet Take 81 mg by mouth daily.    BREO ELLIPTA 200-25 MCG/INH AEPB Inhale 1 puff by mouth once daily   DULoxetine (CYMBALTA) 20 MG capsule Take 1 capsule (20 mg total) by mouth daily.   gabapentin (NEURONTIN) 100 MG capsule Take 1 capsule (100 mg total) by mouth 3 (three) times daily.   levothyroxine (SYNTHROID) 100 MCG tablet Take 1 tablet (100 mcg total) by mouth daily before breakfast.   lisinopril-hydrochlorothiazide (ZESTORETIC) 20-25 MG tablet Take 1 tablet by mouth daily.   omeprazole (PRILOSEC) 40 MG capsule Take 1 capsule (40 mg total) by mouth daily.   simvastatin (ZOCOR) 40 MG tablet Take 1 tablet (40 mg total) by mouth at bedtime.   No facility-administered encounter medications on file as of 07/17/2021.     Allergies (verified) Codeine   History: Past Medical History:  Diagnosis Date   GERD (gastroesophageal reflux disease)    Hyperlipemia    Hypertension    Hypothyroid    Past Surgical History:  Procedure Laterality Date   ABDOMINAL HYSTERECTOMY     BACK SURGERY     CATARACT EXTRACTION     Knee replacemt  2015   Left knee   RETINAL TEAR REPAIR CRYOTHERAPY     Family History  Problem Relation Age of Onset   Bladder Cancer Neg Hx    Kidney cancer Neg Hx    Social History   Socioeconomic History   Marital status: Married    Spouse name: Not on file   Number of children: Not on file   Years of education: Not on file   Highest education level: Not on file  Occupational History   Not on file  Tobacco Use   Smoking status: Former   Smokeless tobacco: Never  Vaping Use   Vaping Use: Never used  Substance and Sexual Activity   Alcohol use: No   Drug use: No   Sexual activity: Not Currently  Other Topics Concern   Not on file  Social History Narrative   Not on file   Social Determinants of Health   Financial Resource Strain: Low Risk    Difficulty of Paying Living Expenses: Not very hard  Food Insecurity: No Food Insecurity   Worried About Running  Out of Food in the Last Year: Never true   Ran Out of Food in the Last Year: Never true  Transportation Needs: No Transportation Needs   Lack of Transportation (Medical): No   Lack of Transportation (Non-Medical): No  Physical Activity: Insufficiently Active   Days of Exercise per Week: 2 days   Minutes of Exercise per Session: 20 min  Stress: Stress Concern Present   Feeling of Stress : To some extent  Social Connections: Unknown   Frequency of Communication with Friends and Family: More than three times a week   Frequency of Social Gatherings with Friends and Family: More than three times a week   Attends Religious Services: Not on file   Active Member of Clubs or Organizations: Yes   Attends Tax inspector Meetings: More than 4 times per year   Marital Status: Widowed    Tobacco Counseling Counseling given: Not Answered   Clinical Intake:  Pre-visit preparation completed: Yes  Pain : 0-10 Pain Score: 2  Pain Type: Other (Comment) (some joint pain) Pain Location: Other (Comment) Pain Orientation: Other (Comment) Pain Radiating Towards: NA Pain Descriptors / Indicators: Aching Pain Onset: More than a month ago Pain Frequency: Occasional Effect of Pain on Daily Activities: NONE     Diabetes: No  How often do you need to have someone help you when you read instructions, pamphlets, or other written materials from your doctor or pharmacy?: 1 - Never What is the last grade level you completed in school?: 12  Diabetic?no  Interpreter Needed?: No  Information entered by :: Melody Comas   Activities of Daily Living In your present state of health, do you have any difficulty performing the following activities: 07/17/2021  Hearing? N  Vision? N  Difficulty concentrating or making decisions? N  Walking or climbing stairs? N  Dressing or bathing? N  Doing errands, shopping? N  Preparing Food and eating ? N  Using the Toilet? N  In the past six months, have you accidently leaked urine? N  Do you have problems with loss of bowel control? N  Managing your Medications? N  Managing your Finances? N  Housekeeping or managing your Housekeeping? N  Some recent data might be hidden    Patient Care Team: Corky Downs, MD as PCP - General (Internal Medicine)  Indicate any recent Medical Services you may have received from other than [facility] providers in the past year (date may be approximate).     Assessment:   This is a routine wellness examination for [name].  Hearing/Vision screen No results found.  Dietary issues and exercise activities discussed: Current Exercise Habits: Home exercise routine, Type of exercise: walking, Time (Minutes): 20, Frequency  (Times/Week): 2, Weekly Exercise (Minutes/Week): 40, Intensity: Mild, Exercise limited by: None identified   Goals Addressed   None    Depression Screen PHQ 2/9 Scores 07/17/2021 06/05/2021 04/09/2020 04/09/2020  PHQ - 2 Score 2 1 0 0  PHQ- 9 Score - 6 0 0    Fall Risk Fall Risk  07/17/2021 06/05/2021 06/05/2021 03/17/2021  Falls in the past year? 1 0 1 1  Number falls in past yr: 1 0 0 0  Injury with Fall? 0 0 0 0  Risk for fall due to : History of fall(s) No Fall Risks History of fall(s) History of fall(s)  Follow up Falls evaluation completed Falls evaluation completed - -    FALL RISK PREVENTION PERTAINING TO THE HOME:  Any stairs in or around  the home? No  If so, are there any without handrails? No  Home free of loose throw rugs in walkways, pet beds, electrical cords, etc? No  Adequate lighting in your home to reduce risk of falls? Yes   ASSISTIVE DEVICES UTILIZED TO PREVENT FALLS:  Life alert? No  Use of a cane, walker or w/c? Yes  Grab bars in the bathroom? No  Shower chair or bench in shower? No  Elevated toilet seat or a handicapped toilet? No   TIMED UP AND GO:  Was the test performed? No .  Length of time to ambulate 10 feet: sec.   Gait steady and fast with assistive device Cognitive Function: MMSE - Mini Mental State Exam 07/17/2021  Orientation to time 5  Orientation to Place 5  Registration 3  Attention/ Calculation 5  Recall 3  Language- name 2 objects 2  Language- repeat 1  Language- follow 3 step command 3  Language- read & follow direction 1  Write a sentence 1  Copy design 1  Total score 30     6CIT Screen 07/17/2021  What Year? 0 points  What month? 0 points  What time? 0 points  Count back from 20 0 points  Months in reverse 0 points  Repeat phrase 0 points  Total Score 0    Immunizations Immunization History  Administered Date(s) Administered   Fluad Quad(high Dose 65+) 09/22/2020   Influenza, High Dose Seasonal PF 08/10/2017    PFIZER(Purple Top)SARS-COV-2 Vaccination 12/11/2019, 01/01/2020, 09/04/2020, 04/21/2021   Pneumococcal Polysaccharide-23 10/20/2005    TDAP status: Due, Education has been provided regarding the importance of this vaccine. Advised may receive this vaccine at local pharmacy or Health Dept. Aware to provide a copy of the vaccination record if obtained from local pharmacy or Health Dept. Verbalized acceptance and understanding.  Flu Vaccine status: Up to date  Pneumococcal vaccine status: Up to date  Covid-19 vaccine status: Completed vaccines  Qualifies for Shingles Vaccine? No   Zostavax completed No   Shingrix Completed?: No.    Education has been provided regarding the importance of this vaccine. Patient has been advised to call insurance company to determine out of pocket expense if they have not yet received this vaccine. Advised may also receive vaccine at local pharmacy or Health Dept. Verbalized acceptance and understanding.  Screening Tests Health Maintenance  Topic Date Due   TETANUS/TDAP  Never done   Zoster Vaccines- Shingrix (1 of 2) Never done   DEXA SCAN  Never done   PNA vac Low Risk Adult (2 of 2 - PCV13) 10/20/2006   INFLUENZA VACCINE  06/15/2021   COVID-19 Vaccine  Completed   HPV VACCINES  Aged Out    Health Maintenance  Health Maintenance Due  Topic Date Due   TETANUS/TDAP  Never done   Zoster Vaccines- Shingrix (1 of 2) Never done   DEXA SCAN  Never done   PNA vac Low Risk Adult (2 of 2 - PCV13) 10/20/2006   INFLUENZA VACCINE  06/15/2021    Colorectal cancer screening: No longer required.   Mammogram status: Completed  . Repeat every year  Bone Density status: Ordered  . Pt provided with contact info and advised to call to schedule appt.  Lung Cancer Screening: (Low Dose CT Chest recommended if Age 42-80 years, 30 pack-year currently smoking OR have quit w/in 15years.) does not qualify.   Lung Cancer Screening Referral:   Additional  Screening:  Hepatitis C Screening: does qualify; Completed  Vision Screening: Recommended annual ophthalmology exams for early detection of glaucoma and other disorders of the eye. Is the patient up to date with their annual eye exam?  Yes  Who is the provider or what is the name of the office in which the patient attends annual eye exams? [facility] If pt is not established with a provider, would they like to be referred to a provider to establish care?  established .   Dental Screening: Recommended annual dental exams for proper oral hygiene  Community Resource Referral / Chronic Care Management: CRR required this visit?  No   CCM required this visit?  No      Plan:     I have personally reviewed and noted the following in the patient's chart:   Medical and social history Use of alcohol, tobacco or illicit drugs  Current medications and supplements including opioid prescriptions.  Functional ability and status Nutritional status Physical activity Advanced directives List of other physicians Hospitalizations, surgeries, and ER visits in previous 12 months Vitals Screenings to include cognitive, depression, and falls Referrals and appointments  In addition, I have reviewed and discussed with patient certain preventive protocols, quality metrics, and best practice recommendations. A written personalized care plan for preventive services as well as general preventive health recommendations were provided to patient.     Melody Comasshley Unice V[place]el, [place]   07/17/2021   Nurse Notes:  Ms. Parke SimmersBland , Thank you for taking time to come for your Medicare Wellness Visit. I appreciate your ongoing commitment to your health goals. Please review the following plan we discussed and let me know if I can assist you in the future.   These are the goals we discussed:  Goals   None     This is a list of the screening recommended for you and due dates:  Health Maintenance  Topic Date Due   Tetanus  Vaccine  Never done   Zoster (Shingles) Vaccine (1 of 2) Never done   DEXA scan (bone density measurement)  Never done   Pneumonia vaccines (2 of 2 - PCV13) 10/20/2006   Flu Shot  06/15/2021   COVID-19 Vaccine  Completed   HPV Vaccine  Aged Out

## 2021-07-21 ENCOUNTER — Other Ambulatory Visit: Payer: Self-pay | Admitting: Internal Medicine

## 2021-07-21 ENCOUNTER — Other Ambulatory Visit: Payer: Self-pay | Admitting: *Deleted

## 2021-07-21 DIAGNOSIS — F419 Anxiety disorder, unspecified: Secondary | ICD-10-CM

## 2021-07-21 MED ORDER — ALPRAZOLAM 0.25 MG PO TABS: 0.2500 mg | ORAL_TABLET | Freq: Two times a day (BID) | ORAL | 0 refills | Status: DC

## 2021-07-23 NOTE — Progress Notes (Signed)
I have reviewed this visit and agree with the documentation.   

## 2021-08-31 ENCOUNTER — Other Ambulatory Visit: Payer: Self-pay

## 2021-08-31 ENCOUNTER — Encounter: Payer: Self-pay | Admitting: Internal Medicine

## 2021-08-31 ENCOUNTER — Ambulatory Visit (INDEPENDENT_AMBULATORY_CARE_PROVIDER_SITE_OTHER): Payer: Medicare Other | Admitting: Internal Medicine

## 2021-08-31 VITALS — BP 155/70 | HR 64 | Ht 62.0 in | Wt 129.7 lb

## 2021-08-31 DIAGNOSIS — M25511 Pain in right shoulder: Secondary | ICD-10-CM

## 2021-08-31 DIAGNOSIS — J219 Acute bronchiolitis, unspecified: Secondary | ICD-10-CM

## 2021-08-31 DIAGNOSIS — F419 Anxiety disorder, unspecified: Secondary | ICD-10-CM | POA: Diagnosis not present

## 2021-08-31 DIAGNOSIS — I10 Essential (primary) hypertension: Secondary | ICD-10-CM

## 2021-08-31 MED ORDER — ALLOPURINOL 100 MG PO TABS: 100.0000 mg | ORAL_TABLET | Freq: Every day | ORAL | 1 refills | Status: DC

## 2021-08-31 MED ORDER — AZITHROMYCIN 250 MG PO TABS: ORAL_TABLET | ORAL | 0 refills | Status: AC

## 2021-08-31 MED ORDER — ALPRAZOLAM 0.25 MG PO TABS: 0.2500 mg | ORAL_TABLET | Freq: Two times a day (BID) | ORAL | 0 refills | Status: DC

## 2021-08-31 NOTE — Assessment & Plan Note (Signed)
We will start the patient on Z-Pak 

## 2021-08-31 NOTE — Progress Notes (Addendum)
Established Patient Office Visit  Subjective:  Patient ID: Gail Mora, female    DOB: Aug 18, 1933  Age: 87 y.o. MRN: [id number]  CC:  Chief Complaint  Patient presents with   Anxiety      Gail Mora presents for rt shoulder pain  and uri also complaining of anxiety  Past Medical History:  Diagnosis Date   GERD (gastroesophageal reflux disease)    Hyperlipemia    Hypertension    Hypothyroid     Past Surgical History:  Procedure Laterality Date   ABDOMINAL HYSTERECTOMY     BACK SURGERY     CATARACT EXTRACTION     Knee replacemt  2015   Left knee   RETINAL TEAR REPAIR CRYOTHERAPY      Family History  Problem Relation Age of Onset   Bladder Cancer Neg Hx    Kidney cancer Neg Hx     Social History   Socioeconomic History   Marital status: Married    Spouse name: Not on file   Number of children: Not on file   Years of education: Not on file   Highest education level: Not on file  Occupational History   Not on file  Tobacco Use   Smoking status: Former   Smokeless tobacco: Never  Vaping Use   Vaping Use: Never used  Substance and Sexual Activity   Alcohol use: No   Drug use: No   Sexual activity: Not Currently  Other Topics Concern   Not on file  Social History Narrative   Not on file   Social Determinants of Health   Financial Resource Strain: Low Risk    Difficulty of Paying Living Expenses: Not very hard  Food Insecurity: No Food Insecurity   Worried About Programme researcher, broadcasting/film/video in the Last Year: Never true   Ran Out of Food in the Last Year: Never true  Transportation Needs: No Transportation Needs   Lack of Transportation (Medical): No   Lack of Transportation (Non-Medical): No  Physical Activity: Insufficiently Active   Days of Exercise per Week: 2 days   Minutes of Exercise per Session: 20 min  Stress: Stress Concern Present   Feeling of Stress : To some extent  Social Connections: Unknown   Frequency of Communication with Friends and  Family: More than three times a week   Frequency of Social Gatherings with Friends and Family: More than three times a week   Attends Religious Services: Not on file   Active Member of Clubs or Organizations: Yes   Attends Banker Meetings: More than 4 times per year   Marital Status: Widowed  Catering manager Violence: Not At Risk   Fear of Current or Ex-Partner: No   Emotionally Abused: No   Physically Abused: No   Sexually Abused: No     Current Outpatient Medications:    Alum & Mag Hydroxide-Simeth (GI COCKTAIL) SUSP suspension, Take 30 mLs by mouth 2 (two) times daily as needed for indigestion (abd pain). Shake well. Each dose to containe 72mL maalox and 65mL viscous lidocaine and 37mL donnatal, Disp: 300 mL, Rfl: 0   amLODipine (NORVASC) 5 MG tablet, Take 1 tablet by mouth once daily, Disp: 90 tablet, Rfl: 0   aspirin EC 81 MG tablet, Take 81 mg by mouth daily. , Disp: , Rfl:    BREO ELLIPTA 200-25 MCG/INH AEPB, Inhale 1 puff by mouth once daily, Disp: 60 each, Rfl: 5   DULoxetine (CYMBALTA) 20 MG capsule, Take  1 capsule (20 mg total) by mouth daily., Disp: 30 capsule, Rfl: 3   gabapentin (NEURONTIN) 100 MG capsule, Take 1 capsule (100 mg total) by mouth 3 (three) times daily., Disp: 90 capsule, Rfl: 3   levothyroxine (SYNTHROID) 100 MCG tablet, Take 1 tablet (100 mcg total) by mouth daily before breakfast., Disp: 90 tablet, Rfl: 3   lisinopril-hydrochlorothiazide (ZESTORETIC) 20-25 MG tablet, Take 1 tablet by mouth once daily, Disp: 90 tablet, Rfl: 0   omeprazole (PRILOSEC) 40 MG capsule, Take 1 capsule (40 mg total) by mouth daily., Disp: 90 capsule, Rfl: 3   simvastatin (ZOCOR) 40 MG tablet, Take 1 tablet (40 mg total) by mouth at bedtime., Disp: 90 tablet, Rfl: 0   allopurinol (ZYLOPRIM) 100 MG tablet, Take 1 tablet (100 mg total) by mouth daily., Disp: 90 tablet, Rfl: 1   ALPRAZolam (XANAX) 0.25 MG tablet, Take 1 tablet (0.25 mg total) by mouth 2 (two) times daily.,  Disp: 60 tablet, Rfl: 0   Allergies  Allergen Reactions   Codeine Nausea Only    ROS Review of Systems  Constitutional: Negative.  Negative for chills, diaphoresis, fatigue and fever.  HENT:  Positive for congestion.   Eyes: Negative.   Respiratory: Negative.  Negative for chest tightness.   Cardiovascular: Negative.   Gastrointestinal: Negative.   Endocrine: Negative.   Genitourinary: Negative.  Negative for dysuria and hematuria.  Musculoskeletal: Negative.   Skin: Negative.   Allergic/Immunologic: Negative.   Neurological: Negative.   Hematological: Negative.   Psychiatric/Behavioral: Negative.    All other systems reviewed and are negative.    Objective:    Physical Exam Vitals reviewed.  Constitutional:      Appearance: Normal appearance.  HENT:     Mouth/Throat:     Mouth: Mucous membranes are moist.  Eyes:     Pupils: Pupils are equal, round, and reactive to light.  Neck:     Vascular: No carotid bruit.  Cardiovascular:     Rate and Rhythm: Normal rate and regular rhythm.     Pulses: Normal pulses.     Heart sounds: Normal heart sounds.  Pulmonary:     Effort: Pulmonary effort is normal. No respiratory distress.     Breath sounds: Normal breath sounds. No wheezing or rales.  Abdominal:     General: Bowel sounds are normal.     Palpations: Abdomen is soft. There is no hepatomegaly, splenomegaly or mass.     Tenderness: There is no abdominal tenderness.     Hernia: No hernia is present.  Musculoskeletal:        General: No tenderness.     Cervical back: Neck supple.     Right lower leg: No edema.     Left lower leg: No edema.  Skin:    Findings: No rash.  Neurological:     Mental Status: She is alert and oriented to person, place, and time.     Motor: No weakness.  Psychiatric:        Mood and Affect: Mood and affect normal.        Behavior: Behavior normal.    BP (!) 155/70   Pulse 64   Ht 5\' 2"  (1.575 m)   Wt 129 lb 11.2 oz (58.8 kg)   BMI  23.72 kg/m  Wt Readings from Last 3 Encounters:  08/31/21 129 lb 11.2 oz (58.8 kg)  06/05/21 129 lb (58.5 kg)  04/21/21 130 lb 9.6 oz (59.2 kg)     Health Maintenance Due  Topic Date Due   TETANUS/TDAP  Never done   Zoster Vaccines- Shingrix (1 of 2) Never done   DEXA SCAN  Never done   Pneumonia Vaccine 96+ Years old (2 - PCV) 10/20/2006   INFLUENZA VACCINE  06/15/2021   COVID-19 Vaccine (5 - Booster for Pfizer series) 06/16/2021    There are no preventive care reminders to display for this patient.  No results found for: TSH Lab Results  Component Value Date   WBC 7.3 05/04/2018   HGB 12.6 05/04/2018   HCT 35.4 05/04/2018   MCV 96.2 05/04/2018   PLT 222 05/04/2018   Lab Results  Component Value Date   NA 136 04/03/2020   K 4.8 04/03/2020   CO2 24 04/03/2020   GLUCOSE 88 04/03/2020   BUN 21 04/03/2020   CREATININE 1.32 (H) 04/03/2020   BILITOT 0.7 04/03/2020   ALKPHOS 44 11/30/2015   AST 18 04/03/2020   ALT 10 04/03/2020   PROT 6.9 04/03/2020   ALBUMIN 4.1 11/30/2015   CALCIUM 10.4 04/03/2020   ANIONGAP 6 05/04/2018   No results found for: CHOL No results found for: HDL No results found for: LDLCALC No results found for: TRIG No results found for: CHOLHDL No results found for: LOVF6E    Assessment & Plan:   Problem List Items Addressed This Visit       Cardiovascular and Mediastinum   Essential hypertension, benign - Primary     Patient denies any chest pain or shortness of breath there is no history of palpitation or paroxysmal nocturnal dyspnea   patient was advised to follow low-salt low-cholesterol diet    ideally I want to keep systolic blood pressure below 332 mmHg, patient was asked to check blood pressure one times a week and give me a report on that.  Patient will be follow-up in 3 months  or earlier as needed, patient will call me back for any change in the cardiovascular symptoms Patient was advised to buy a book from local bookstore  concerning blood pressure and read several chapters  every day.  This will be supplemented by some of the material we will give him from the office.  Patient should also utilize other resources like YouTube and Internet to learn more about the blood pressure and the diet.        Respiratory   Bronchiolitis    We will start the patient on Z-Pak        Other   Anxiety disorder     Keeping a stress/anxiety diary. This can help you learn what triggers your reaction and then learn ways to manage your response.  Thinking about how you react to certain situations. You may not be able to control everything, but you can control your response.  Making time for activities that help you relax and not feeling guilty about spending your time in this way.  Visual imagery and yoga can help you stay calm and relax.  Patient is not depressed      Relevant Medications   ALPRAZolam (XANAX) 0.25 MG tablet   Right shoulder pain    Suggest shoulder exercises on the right side      Relevant Medications   allopurinol (ZYLOPRIM) 100 MG tablet    Meds ordered this encounter  Medications   ALPRAZolam (XANAX) 0.25 MG tablet    Sig: Take 1 tablet (0.25 mg total) by mouth 2 (two) times daily.    Dispense:  60 tablet    Refill:  0   allopurinol (ZYLOPRIM) 100 MG tablet    Sig: Take 1 tablet (100 mg total) by mouth daily.    Dispense:  90 tablet    Refill:  1   azithromycin (ZITHROMAX) 250 MG tablet    Sig: Take 2 tablets on day 1, then 1 tablet daily on days 2 through 5    Dispense:  6 tablet    Refill:  0    Follow-up: No follow-ups on file.    Corky Downs, MD

## 2021-08-31 NOTE — Assessment & Plan Note (Signed)
Suggest shoulder exercises on the right side

## 2021-08-31 NOTE — Assessment & Plan Note (Signed)
   Keeping a stress/anxiety diary. This can help you learn what triggers your reaction and then learn ways to manage your response.  Thinking about how you react to certain situations. You may not be able to control everything, but you can control your response.  Making time for activities that help you relax and not feeling guilty about spending your time in this way.  Visual imagery and yoga can help you stay calm and relax.  Patient is not depressed 

## 2021-08-31 NOTE — Assessment & Plan Note (Signed)

## 2021-09-15 ENCOUNTER — Other Ambulatory Visit (INDEPENDENT_AMBULATORY_CARE_PROVIDER_SITE_OTHER): Payer: Medicare Other

## 2021-09-15 ENCOUNTER — Other Ambulatory Visit: Payer: Self-pay

## 2021-09-15 DIAGNOSIS — Z23 Encounter for immunization: Secondary | ICD-10-CM | POA: Diagnosis not present

## 2021-09-15 DIAGNOSIS — Z0000 Encounter for general adult medical examination without abnormal findings: Secondary | ICD-10-CM | POA: Diagnosis not present

## 2021-09-16 LAB — COMPLETE METABOLIC PANEL WITH GFR
AG Ratio: 1.4 (calc) (ref 1.0–2.5)
ALT: 6 U/L (ref 6–29)
AST: 13 U/L (ref 10–35)
Albumin: 3.9 g/dL (ref 3.6–5.1)
Alkaline phosphatase (APISO): 62 U/L (ref 37–153)
BUN/Creatinine Ratio: 16 (calc) (ref 6–22)
BUN: 26 mg/dL — ABNORMAL HIGH (ref 7–25)
CO2: 21 mmol/L (ref 20–32)
Calcium: 10.9 mg/dL — ABNORMAL HIGH (ref 8.6–10.4)
Chloride: 105 mmol/L (ref 98–110)
Creat: 1.6 mg/dL — ABNORMAL HIGH (ref 0.60–0.95)
Globulin: 2.7 g/dL (calc) (ref 1.9–3.7)
Glucose, Bld: 83 mg/dL (ref 65–99)
Potassium: 4.4 mmol/L (ref 3.5–5.3)
Sodium: 138 mmol/L (ref 135–146)
Total Bilirubin: 0.4 mg/dL (ref 0.2–1.2)
Total Protein: 6.6 g/dL (ref 6.1–8.1)
eGFR: 31 mL/min/{1.73_m2} — ABNORMAL LOW (ref >=60)

## 2021-09-16 LAB — CBC WITH DIFFERENTIAL/PLATELET
Absolute Monocytes: 390 cells/uL (ref 200–950)
Basophils Absolute: 67 cells/uL (ref 0–200)
Basophils Relative: 1.1 %
Eosinophils Absolute: 549 cells/uL — ABNORMAL HIGH (ref 15–500)
Eosinophils Relative: 9 %
HCT: 35.3 % (ref 35.0–45.0)
Hemoglobin: 12 g/dL (ref 11.7–15.5)
Lymphs Abs: 1623 cells/uL (ref 850–3900)
MCH: 32.3 pg (ref 27.0–33.0)
MCHC: 34 g/dL (ref 32.0–36.0)
MCV: 95.1 fL (ref 80.0–100.0)
MPV: 10.4 fL (ref 7.5–12.5)
Monocytes Relative: 6.4 %
Neutro Abs: 3471 cells/uL (ref 1500–7800)
Neutrophils Relative %: 56.9 %
Platelets: 246 10*3/uL (ref 140–400)
RBC: 3.71 10*6/uL — ABNORMAL LOW (ref 3.80–5.10)
RDW: 13.7 % (ref 11.0–15.0)
Total Lymphocyte: 26.6 %
WBC: 6.1 10*3/uL (ref 3.8–10.8)

## 2021-09-16 LAB — LIPID PANEL
Cholesterol: 309 mg/dL — ABNORMAL HIGH (ref <200)
HDL: 44 mg/dL — ABNORMAL LOW (ref >=50)
LDL Cholesterol (Calc): 219 mg/dL (calc) — ABNORMAL HIGH
Non-HDL Cholesterol (Calc): 265 mg/dL (calc) — ABNORMAL HIGH (ref <130)
Total CHOL/HDL Ratio: 7 (calc) — ABNORMAL HIGH (ref <5.0)
Triglycerides: 246 mg/dL — ABNORMAL HIGH (ref <150)

## 2021-09-16 LAB — TSH: TSH: 5.2 mIU/L — ABNORMAL HIGH (ref 0.40–4.50)

## 2021-10-02 ENCOUNTER — Encounter: Payer: Self-pay | Admitting: Emergency Medicine

## 2021-10-02 ENCOUNTER — Emergency Department: Payer: Medicare Other

## 2021-10-02 ENCOUNTER — Other Ambulatory Visit: Payer: Self-pay

## 2021-10-02 ENCOUNTER — Emergency Department
Admission: EM | Admit: 2021-10-02 | Discharge: 2021-10-02 | Disposition: A | Discharge status: Home / Self Care | Payer: Medicare Other | Attending: Emergency Medicine | Admitting: Emergency Medicine

## 2021-10-02 DIAGNOSIS — M5432 Sciatica, left side: Secondary | ICD-10-CM | POA: Diagnosis not present

## 2021-10-02 DIAGNOSIS — E039 Hypothyroidism, unspecified: Secondary | ICD-10-CM | POA: Diagnosis not present

## 2021-10-02 DIAGNOSIS — I10 Essential (primary) hypertension: Secondary | ICD-10-CM | POA: Diagnosis not present

## 2021-10-02 DIAGNOSIS — Z79899 Other long term (current) drug therapy: Secondary | ICD-10-CM | POA: Diagnosis not present

## 2021-10-02 DIAGNOSIS — Z96652 Presence of left artificial knee joint: Secondary | ICD-10-CM | POA: Diagnosis not present

## 2021-10-02 DIAGNOSIS — Z87891 Personal history of nicotine dependence: Secondary | ICD-10-CM | POA: Insufficient documentation

## 2021-10-02 DIAGNOSIS — M5136 Other intervertebral disc degeneration, lumbar region: Secondary | ICD-10-CM | POA: Diagnosis not present

## 2021-10-02 DIAGNOSIS — M2578 Osteophyte, vertebrae: Secondary | ICD-10-CM | POA: Diagnosis not present

## 2021-10-02 DIAGNOSIS — Z7982 Long term (current) use of aspirin: Secondary | ICD-10-CM | POA: Diagnosis not present

## 2021-10-02 DIAGNOSIS — M5442 Lumbago with sciatica, left side: Secondary | ICD-10-CM | POA: Diagnosis not present

## 2021-10-02 DIAGNOSIS — M79605 Pain in left leg: Secondary | ICD-10-CM | POA: Diagnosis not present

## 2021-10-02 DIAGNOSIS — M545 Low back pain, unspecified: Secondary | ICD-10-CM | POA: Diagnosis not present

## 2021-10-02 MED ORDER — DEXAMETHASONE SODIUM PHOSPHATE 10 MG/ML IJ SOLN
10.0000 mg | Freq: Once | INTRAMUSCULAR | Status: AC
  Administered 2021-10-02: 10 mg via INTRAMUSCULAR
  Filled 2021-10-02: qty 1

## 2021-10-02 NOTE — ED Provider Notes (Signed)
[facility] Emergency Department Provider Note  ____________________________________________   Event Date/Time   First MD Initiated Contact with Patient 10/02/21 [phone]     (approximate)  I have reviewed the triage vital signs and the nursing notes.   HISTORY  Chief Complaint Leg Pain   HPI Gail Mora is a 87 y.o. female presents to the ED with complaint of left leg radiculopathy for 1 week.  Patient states that she took ibuprofen at 2 AM and this is no longer helping.  She denies any history of recent falls.  Patient has had back surgery but does not remember what year.  She was prescribed Neurontin by her PCP but states that it has been "a long time since it was taken".  She denies any saddle anesthesias or incontinence of bowel or bladder.  She rates her pain as 6 out of 10.       Past Medical History:  Diagnosis Date   GERD (gastroesophageal reflux disease)    Hyperlipemia    Hypertension    Hypothyroid     Patient Active Problem List   Diagnosis Date Noted   Bronchiolitis 08/31/2021   Situational depression 06/05/2021   Need for influenza vaccination 09/28/2020   Hyperlipidemia 07/02/2020   Annual physical exam 05/06/2020   Essential hypertension, benign 04/11/2020   Right shoulder pain 04/11/2020   Anxiety disorder 03/28/2020    Past Surgical History:  Procedure Laterality Date   ABDOMINAL HYSTERECTOMY     BACK SURGERY     CATARACT EXTRACTION     Knee replacemt  2015   Left knee   RETINAL TEAR REPAIR CRYOTHERAPY      Prior to Admission medications   Medication Sig Start Date End Date Taking? Authorizing Provider  allopurinol (ZYLOPRIM) 100 MG tablet Take 1 tablet (100 mg total) by mouth daily. 08/31/21   Corky Downs, MD  ALPRAZolam Prudy Feeler) 0.25 MG tablet Take 1 tablet (0.25 mg total) by mouth 2 (two) times daily. 08/31/21   Corky Downs, MD  Alum & Mag Hydroxide-Simeth (GI COCKTAIL) SUSP suspension Take 30 mLs by mouth 2  (two) times daily as needed for indigestion (abd pain). Shake well. Each dose to containe 78mL maalox and 78mL viscous lidocaine and 46mL donnatal 05/04/18   Minna Antis, MD  amLODipine (NORVASC) 5 MG tablet Take 1 tablet by mouth once daily 08/11/20   Corky Downs, MD  aspirin EC 81 MG tablet Take 81 mg by mouth daily.     [provider]  Earlie Server 119-14 MCG/INH AEPB Inhale 1 puff by mouth once daily 11/24/20   Corky Downs, MD  DULoxetine (CYMBALTA) 20 MG capsule Take 1 capsule (20 mg total) by mouth daily. 06/05/21   Irish Lack, FNP  gabapentin (NEURONTIN) 100 MG capsule Take 1 capsule (100 mg total) by mouth 3 (three) times daily. 04/11/20   Corky Downs, MD  levothyroxine (SYNTHROID) 100 MCG tablet Take 1 tablet (100 mcg total) by mouth daily before breakfast. 07/17/20   Corky Downs, MD  lisinopril-hydrochlorothiazide (ZESTORETIC) 20-25 MG tablet Take 1 tablet by mouth once daily 07/21/21   Corky Downs, MD  omeprazole (PRILOSEC) 40 MG capsule Take 1 capsule (40 mg total) by mouth daily. 03/17/21   Corky Downs, MD  simvastatin (ZOCOR) 40 MG tablet Take 1 tablet (40 mg total) by mouth at bedtime. 07/02/20   Corky Downs, MD    Allergies Codeine  Family History  Problem Relation Age of Onset   Bladder Cancer Neg  Hx    Kidney cancer Neg Hx     Social History Social History   Tobacco Use   Smoking status: Former   Smokeless tobacco: Never  Building services engineer Use: Never used  Substance Use Topics   Alcohol use: No   Drug use: No    Review of Systems Constitutional: No fever/chills Eyes: No visual changes. Cardiovascular: Denies chest pain. Respiratory: Denies shortness of breath. Gastrointestinal: No abdominal pain.  No nausea, no vomiting.  Genitourinary: Negative for dysuria. Musculoskeletal: Status post back surgery.  Positive left leg radiculopathy. Skin: Negative for rash. Neurological: Negative for headaches, focal weakness or  numbness. ____________________________________________   PHYSICAL EXAM:  VITAL SIGNS: ED Triage Vitals  Enc Vitals Group     BP 10/02/21 0603 (!) 174/71     Pulse Rate 10/02/21 0603 67     Resp 10/02/21 0603 18     Temp 10/02/21 0603 97.7 F (36.5 C)     Temp Source 10/02/21 0603 Oral     SpO2 10/02/21 0603 97 %     Weight 10/02/21 0606 128 lb (58.1 kg)     Height 10/02/21 0606 5\' 2"  (1.575 m)     Head Circumference --      Peak Flow --      Pain Score 10/02/21 0606 6     Pain Loc --      Pain Edu? --      Excl. in GC? --     Constitutional: Alert and oriented. Well appearing and in no acute distress. Eyes: Conjunctivae are normal. PERRL. EOMI. Head: Atraumatic. Nose: No congestion/rhinnorhea. Neck: No stridor.   Cardiovascular: Normal rate, regular rhythm. Grossly normal heart sounds.  Good peripheral circulation. Respiratory: Normal respiratory effort.  No retractions. Lungs CTAB. Gastrointestinal: Soft and nontender. No distention. No abdominal bruits. No CVA tenderness. Musculoskeletal: No point tenderness is noted on palpation of the thoracic or lumbar spine.  To the left buttocks and SI joint area there is moderate tenderness.  Straight leg raises increases her pain in the left SI joint.  Good muscle strength bilaterally at 5/5. Neurologic:  Normal speech and language.  Reflexes were 1+ bilaterally.  No gross focal neurologic deficits are appreciated.  Gait was not tested while in the ED. Skin:  Skin is warm, dry and intact. No rash noted. Psychiatric: Mood and affect are normal. Speech and behavior are normal.  ____________________________________________   LABS (all labs ordered are listed, but only abnormal results are displayed)  Labs Reviewed - No data to display ____________________________________________  RADIOLOGY I, 10/04/21, personally viewed and evaluated these images (plain radiographs) as part of my medical decision making, as well as  reviewing the written report by the radiologist.   Official radiology report(s): DG Lumbar Spine 2-3 Views  Result Date: 10/02/2021 CLINICAL DATA:  Left leg pain, radiculopathy, history of back surgery EXAM: LUMBAR SPINE - 2-3 VIEW COMPARISON:  None. FINDINGS: There is no evidence of lumbar spine fracture. Alignment is near anatomical. Severe multilevel DA and disc disease with disc space narrowing and marginal osteophytes. There is associated multilevel facet joint arthropathy prominent at L5-S1. IMPRESSION: 1.  No acute fracture or subluxation. 2.  Advanced multilevel degenerative disc disease. Electronically Signed   By: 10/04/2021 D.O.   On: 10/02/2021 08:35    ____________________________________________   PROCEDURES  Procedure(s) performed (including Critical Care):  Procedures   ____________________________________________   INITIAL IMPRESSION / ASSESSMENT AND PLAN / ED COURSE  As part of my medical decision making, I reviewed the following data within the electronic MEDICAL RECORD NUMBER Notes from prior ED visits and [place] Controlled Substance Database  87 year old female presents to the ED with complaint of pain from her left buttocks radiating into her left leg.  Patient states she has a history of neuropathy and looking through her records it was noted that she was prescribed gabapentin 100 mg 3 times daily which she states she has not taken in "a long time".  Patient did take ibuprofen at 2:30 AM and had some relief until it wore off at approximately 8 AM.  Patient denies any urinary symptoms or recent injury to her back.  Patient is moderately tender on palpation of the left SI joint area.  X-rays were obtained and noted that she has multiple level degenerative disc disease.  Patient was given Decadron 10 mg IM while in the ED and prior to discharge states that she is getting some relief from her discomfort.  We discussed restarting the gabapentin starting with 1 tablet today and  increasing gradually till she is back on her 1 tablet 3 times daily as her PCP prescribed.  She is also aware that she can use ice or heat to her back as needed for discomfort.  She will follow-up with her PCP if any continued problems.   ____________________________________________   FINAL CLINICAL IMPRESSION(S) / ED DIAGNOSES  Final diagnoses:  Sciatica of left side  Degenerative disc disease, lumbar     ED Discharge Orders     None        Note:  This document was prepared using Dragon voice recognition software and may include unintentional dictation errors.    Tommi Rumps, PA-C 10/02/21 1241    Chesley Noon, MD 10/02/21 [phone]

## 2021-10-02 NOTE — Discharge Instructions (Addendum)
Follow-up with your primary care provider if any continued problems or concerns.  You may use ice or heat to your back and left buttocks area to see if this helps.  Also begin taking your gabapentin that has been prescribed by your primary care provider.  Begin with 1 tablet daily for 3 days and then increase to 2 tablets for 3 days and if not having any difficulties you may resume your 3 tablets/day as previously taken.  You may also take Tylenol or limited amounts of ibuprofen with this medication if additional pain medication is needed.

## 2021-10-02 NOTE — ED Triage Notes (Addendum)
Pt arrived via POV with daughter reports pain to L buttocks radiating down to L leg, pt states she has neuropathy pt states the sxs began a couple of days ago. Pt is taking ibuprofen for pain. Last dose around 230 states she took #2 tabs.  Pt reports the pain is present when she is sitting on L buttock.

## 2021-10-05 ENCOUNTER — Other Ambulatory Visit: Payer: Self-pay

## 2021-10-05 ENCOUNTER — Ambulatory Visit (INDEPENDENT_AMBULATORY_CARE_PROVIDER_SITE_OTHER): Payer: Medicare Other | Admitting: Internal Medicine

## 2021-10-05 ENCOUNTER — Encounter: Payer: Self-pay | Admitting: Internal Medicine

## 2021-10-05 VITALS — HR 66 | Ht 62.0 in | Wt 130.9 lb

## 2021-10-05 DIAGNOSIS — E785 Hyperlipidemia, unspecified: Secondary | ICD-10-CM

## 2021-10-05 DIAGNOSIS — M5442 Lumbago with sciatica, left side: Secondary | ICD-10-CM

## 2021-10-05 DIAGNOSIS — I10 Essential (primary) hypertension: Secondary | ICD-10-CM | POA: Diagnosis not present

## 2021-10-05 MED ORDER — TRAMADOL HCL 50 MG PO TABS: 50.0000 mg | ORAL_TABLET | Freq: Two times a day (BID) | ORAL | 0 refills | Status: AC | PRN

## 2021-10-05 NOTE — Assessment & Plan Note (Signed)
Hypercholesterolemia  I advised the patient to follow Mediterranean diet This diet is rich in fruits vegetables and whole grain, and This diet is also rich in fish and lean meat Patient should also eat a handful of almonds or walnuts daily Recent heart study indicated that average follow-up on this kind of diet reduces the cardiovascular mortality by 50 to 70%== 

## 2021-10-05 NOTE — Assessment & Plan Note (Signed)
-   Patient's back pain is under control with medication.  - Encouraged the patient to stretch or do yoga as able to help with back pain 

## 2021-10-05 NOTE — Progress Notes (Signed)
Established Patient Office Visit  Subjective:  Patient ID: Gail Mora, female    DOB: [birth year]  Age: 87 y.o. MRN: [id number]  CC:  Chief Complaint  Patient presents with   Follow-up    Patient went to ed with sciatic pain. They did xray of lumbar spine. They gave her a shot for the pain and patient is already on gabapentin.    Back Pain The current episode started in the past 7 days. The problem has been waxing and waning since onset. The pain is present in the gluteal and sacro-iliac. The quality of the pain is described as cramping and shooting. The pain radiates to the left thigh. The pain is at a severity of 4/10.   Gail Mora presents for back pain evaluation Past Medical History:  Diagnosis Date   GERD (gastroesophageal reflux disease)    Hyperlipemia    Hypertension    Hypothyroid     Past Surgical History:  Procedure Laterality Date   ABDOMINAL HYSTERECTOMY     BACK SURGERY     CATARACT EXTRACTION     Knee replacemt  2015   Left knee   RETINAL TEAR REPAIR CRYOTHERAPY      Family History  Problem Relation Age of Onset   Bladder Cancer Neg Hx    Kidney cancer Neg Hx     Social History   Socioeconomic History   Marital status: Married    Spouse name: Not on file   Number of children: Not on file   Years of education: Not on file   Highest education level: Not on file  Occupational History   Not on file  Tobacco Use   Smoking status: Former   Smokeless tobacco: Never  Vaping Use   Vaping Use: Never used  Substance and Sexual Activity   Alcohol use: No   Drug use: No   Sexual activity: Not Currently  Other Topics Concern   Not on file  Social History Narrative   Not on file   Social Determinants of Health   Financial Resource Strain: Low Risk    Difficulty of Paying Living Expenses: Not very hard  Food Insecurity: No Food Insecurity   Worried About Running Out of Food in the Last Year: Never true   Ran Out of Food in the Last Year:  Never true  Transportation Needs: No Transportation Needs   Lack of Transportation (Medical): No   Lack of Transportation (Non-Medical): No  Physical Activity: Insufficiently Active   Days of Exercise per Week: 2 days   Minutes of Exercise per Session: 20 min  Stress: Stress Concern Present   Feeling of Stress : To some extent  Social Connections: Unknown   Frequency of Communication with Friends and Family: More than three times a week   Frequency of Social Gatherings with Friends and Family: More than three times a week   Attends Religious Services: Not on file   Active Member of Clubs or Organizations: Yes   Attends Archivist Meetings: More than 4 times per year   Marital Status: Widowed  Human resources officer Violence: Not At Risk   Fear of Current or Ex-Partner: No   Emotionally Abused: No   Physically Abused: No   Sexually Abused: No     Current Outpatient Medications:    traMADol (ULTRAM) 50 MG tablet, Take 1 tablet (50 mg total) by mouth every 12 (twelve) hours as needed for up to 20 days., Disp: 40 tablet, Rfl: 0  allopurinol (ZYLOPRIM) 100 MG tablet, Take 1 tablet (100 mg total) by mouth daily., Disp: 90 tablet, Rfl: 1   ALPRAZolam (XANAX) 0.25 MG tablet, Take 1 tablet (0.25 mg total) by mouth 2 (two) times daily., Disp: 60 tablet, Rfl: 0   Alum & Mag Hydroxide-Simeth (GI COCKTAIL) SUSP suspension, Take 30 mLs by mouth 2 (two) times daily as needed for indigestion (abd pain). Shake well. Each dose to containe 62m maalox and 15mviscous lidocaine and 53m67monnatal, Disp: 300 mL, Rfl: 0   amLODipine (NORVASC) 5 MG tablet, Take 1 tablet by mouth once daily, Disp: 90 tablet, Rfl: 0   aspirin EC 81 MG tablet, Take 81 mg by mouth daily. , Disp: , Rfl:    BREO ELLIPTA 200-25 MCG/INH AEPB, Inhale 1 puff by mouth once daily, Disp: 60 each, Rfl: 5   DULoxetine (CYMBALTA) 20 MG capsule, Take 1 capsule (20 mg total) by mouth daily., Disp: 30 capsule, Rfl: 3   gabapentin  (NEURONTIN) 100 MG capsule, Take 1 capsule (100 mg total) by mouth 3 (three) times daily., Disp: 90 capsule, Rfl: 3   levothyroxine (SYNTHROID) 100 MCG tablet, Take 1 tablet (100 mcg total) by mouth daily before breakfast., Disp: 90 tablet, Rfl: 3   lisinopril-hydrochlorothiazide (ZESTORETIC) 20-25 MG tablet, Take 1 tablet by mouth once daily, Disp: 90 tablet, Rfl: 0   omeprazole (PRILOSEC) 40 MG capsule, Take 1 capsule (40 mg total) by mouth daily., Disp: 90 capsule, Rfl: 3   simvastatin (ZOCOR) 40 MG tablet, Take 1 tablet (40 mg total) by mouth at bedtime., Disp: 90 tablet, Rfl: 0   Allergies  Allergen Reactions   Codeine Nausea Only    ROS Review of Systems  Constitutional: Negative.   HENT: Negative.    Eyes: Negative.   Respiratory: Negative.    Cardiovascular: Negative.   Gastrointestinal: Negative.   Endocrine: Negative.   Genitourinary: Negative.   Musculoskeletal:  Positive for back pain.  Skin: Negative.   Allergic/Immunologic: Negative.   Neurological: Negative.   Hematological: Negative.   Psychiatric/Behavioral: Negative.    All other systems reviewed and are negative.    Objective:    Physical Exam Vitals reviewed.  Constitutional:      Appearance: Normal appearance.  HENT:     Mouth/Throat:     Mouth: Mucous membranes are moist.  Eyes:     Pupils: Pupils are equal, round, and reactive to light.  Neck:     Vascular: No carotid bruit.  Cardiovascular:     Rate and Rhythm: Normal rate and regular rhythm.     Pulses: Normal pulses.     Heart sounds: Normal heart sounds.  Pulmonary:     Effort: Pulmonary effort is normal.     Breath sounds: Normal breath sounds.  Abdominal:     General: Bowel sounds are normal.     Palpations: Abdomen is soft. There is no hepatomegaly, splenomegaly or mass.     Tenderness: There is no abdominal tenderness.     Hernia: No hernia is present.  Musculoskeletal:        General: No tenderness.     Cervical back: Neck  supple.     Right lower leg: No edema.     Left lower leg: No edema.  Skin:    Findings: No bruising, erythema, lesion or rash.     Comments: Lt slr positive  Neurological:     Mental Status: She is alert and oriented to person, place, and time.  Motor: No weakness.  Psychiatric:        Mood and Affect: Mood and affect normal.        Behavior: Behavior normal.  Jump:  Pulse 66   Ht 5' 2" (1.575 m)   Wt 130 lb 14.4 oz (59.4 kg)   BMI 23.94 kg/m  Wt Readings from Last 3 Encounters:  10/05/21 130 lb 14.4 oz (59.4 kg)  10/02/21 128 lb (58.1 kg)  08/31/21 129 lb 11.2 oz (58.8 kg)     Health Maintenance Due  Topic Date Due   Zoster Vaccines- Shingrix (1 of 2) Never done   Pneumonia Vaccine 65+ Years old (2 - PCV) 10/20/2006   COVID-19 Vaccine (5 - Booster for Pfizer series) 06/16/2021    There are no preventive care reminders to display for this patient.  Lab Results  Component Value Date   TSH 5.20 (H) 09/15/2021   Lab Results  Component Value Date   WBC 6.1 09/15/2021   HGB 12.0 09/15/2021   HCT 35.3 09/15/2021   MCV 95.1 09/15/2021   PLT 246 09/15/2021   Lab Results  Component Value Date   NA 138 09/15/2021   K 4.4 09/15/2021   CO2 21 09/15/2021   GLUCOSE 83 09/15/2021   BUN 26 (H) 09/15/2021   CREATININE 1.60 (H) 09/15/2021   BILITOT 0.4 09/15/2021   ALKPHOS 44 11/30/2015   AST 13 09/15/2021   ALT 6 09/15/2021   PROT 6.6 09/15/2021   ALBUMIN 4.1 11/30/2015   CALCIUM 10.9 (H) 09/15/2021   ANIONGAP 6 05/04/2018   EGFR 31 (L) 09/15/2021   Lab Results  Component Value Date   CHOL 309 (H) 09/15/2021   Lab Results  Component Value Date   HDL 44 (L) 09/15/2021   Lab Results  Component Value Date   LDLCALC 219 (H) 09/15/2021   Lab Results  Component Value Date   TRIG 246 (H) 09/15/2021   Lab Results  Component Value Date   CHOLHDL 7.0 (H) 09/15/2021   No results found for: HGBA1C    Assessment & Plan:   Problem List Items Addressed  This Visit       Cardiovascular and Mediastinum   Essential hypertension, benign - Primary    Stop Ambien        Nervous and Auditory   Left-sided low back pain with left-sided sciatica    - Patient's back pain is under control with medication.  - Encouraged the patient to stretch or do yoga as able to help with back pain      Relevant Medications   traMADol (ULTRAM) 50 MG tablet     Other   Hyperlipidemia    Hypercholesterolemia  I advised the patient to follow Mediterranean diet This diet is rich in fruits vegetables and whole grain, and This diet is also rich in fish and lean meat Patient should also eat a handful of almonds or walnuts daily Recent heart study indicated that average follow-up on this kind of diet reduces the cardiovascular mortality by 50 to 70%==       Meds ordered this encounter  Medications   traMADol (ULTRAM) 50 MG tablet    Sig: Take 1 tablet (50 mg total) by mouth every 12 (twelve) hours as needed for up to 20 days.    Dispense:  40 tablet    Refill:  0    Follow-up: No follow-ups on file.    Javed Masoud, MD  

## 2021-10-05 NOTE — Assessment & Plan Note (Signed)
- 

## 2021-10-18 ENCOUNTER — Other Ambulatory Visit: Payer: Self-pay | Admitting: Internal Medicine

## 2021-10-18 DIAGNOSIS — M25511 Pain in right shoulder: Secondary | ICD-10-CM

## 2021-10-21 ENCOUNTER — Other Ambulatory Visit: Payer: Self-pay | Admitting: Internal Medicine

## 2021-10-21 ENCOUNTER — Other Ambulatory Visit: Payer: Self-pay | Admitting: *Deleted

## 2021-10-26 ENCOUNTER — Ambulatory Visit: Payer: Medicare Other | Admitting: Internal Medicine

## 2021-11-18 ENCOUNTER — Other Ambulatory Visit: Payer: Self-pay

## 2021-11-18 ENCOUNTER — Encounter: Payer: Self-pay | Admitting: Internal Medicine

## 2021-11-18 ENCOUNTER — Ambulatory Visit (INDEPENDENT_AMBULATORY_CARE_PROVIDER_SITE_OTHER): Payer: Medicare Other | Admitting: Internal Medicine

## 2021-11-18 VITALS — BP 140/80 | HR 78 | Ht 62.0 in | Wt 134.9 lb

## 2021-11-18 DIAGNOSIS — M5442 Lumbago with sciatica, left side: Secondary | ICD-10-CM | POA: Diagnosis not present

## 2021-11-18 DIAGNOSIS — M25511 Pain in right shoulder: Secondary | ICD-10-CM

## 2021-11-18 DIAGNOSIS — E785 Hyperlipidemia, unspecified: Secondary | ICD-10-CM

## 2021-11-18 DIAGNOSIS — I10 Essential (primary) hypertension: Secondary | ICD-10-CM | POA: Diagnosis not present

## 2021-11-18 DIAGNOSIS — F419 Anxiety disorder, unspecified: Secondary | ICD-10-CM | POA: Diagnosis not present

## 2021-11-18 MED ORDER — TRIAMCINOLONE ACETONIDE 40 MG/ML IJ SUSP
40.0000 mg | Freq: Once | INTRAMUSCULAR | Status: AC
  Administered 2021-11-18: 40 mg via INTRAMUSCULAR

## 2021-11-18 MED ORDER — GABAPENTIN 100 MG PO CAPS: 100.0000 mg | ORAL_CAPSULE | ORAL | 3 refills | Status: DC

## 2021-11-18 NOTE — Progress Notes (Signed)
Established Patient Office Visit  Subjective:  Patient ID: Gail Mora, female    DOB: [birth year]  Age: 88 y.o. MRN: [id number]  CC:  Chief Complaint  Patient presents with   Back Pain    Sciatica pain    Back Pain   Gail Mora presents for back pain  with sciatica  on lt side  Past Medical History:  Diagnosis Date   GERD (gastroesophageal reflux disease)    Hyperlipemia    Hypertension    Hypothyroid     Past Surgical History:  Procedure Laterality Date   ABDOMINAL HYSTERECTOMY     BACK SURGERY     CATARACT EXTRACTION     Knee replacemt  2015   Left knee   RETINAL TEAR REPAIR CRYOTHERAPY      Family History  Problem Relation Age of Onset   Bladder Cancer Neg Hx    Kidney cancer Neg Hx     Social History   Socioeconomic History   Marital status: Married    Spouse name: Not on file   Number of children: Not on file   Years of education: Not on file   Highest education level: Not on file  Occupational History   Not on file  Tobacco Use   Smoking status: Former   Smokeless tobacco: Never  Vaping Use   Vaping Use: Never used  Substance and Sexual Activity   Alcohol use: No   Drug use: No   Sexual activity: Not Currently  Other Topics Concern   Not on file  Social History Narrative   Not on file   Social Determinants of Health   Financial Resource Strain: Low Risk    Difficulty of Paying Living Expenses: Not very hard  Food Insecurity: No Food Insecurity   Worried About Charity fundraiser in the Last Year: Never true   Ran Out of Food in the Last Year: Never true  Transportation Needs: No Transportation Needs   Lack of Transportation (Medical): No   Lack of Transportation (Non-Medical): No  Physical Activity: Insufficiently Active   Days of Exercise per Week: 2 days   Minutes of Exercise per Session: 20 min  Stress: Stress Concern Present   Feeling of Stress : To some extent  Social Connections: Unknown   Frequency of Communication  with Friends and Family: More than three times a week   Frequency of Social Gatherings with Friends and Family: More than three times a week   Attends Religious Services: Not on file   Active Member of Clubs or Organizations: Yes   Attends Archivist Meetings: More than 4 times per year   Marital Status: Widowed  Human resources officer Violence: Not At Risk   Fear of Current or Ex-Partner: No   Emotionally Abused: No   Physically Abused: No   Sexually Abused: No     Current Outpatient Medications:    allopurinol (ZYLOPRIM) 100 MG tablet, Take 1 tablet (100 mg total) by mouth daily., Disp: 90 tablet, Rfl: 1   ALPRAZolam (XANAX) 0.25 MG tablet, Take 1 tablet (0.25 mg total) by mouth 2 (two) times daily., Disp: 60 tablet, Rfl: 0   Alum & Mag Hydroxide-Simeth (GI COCKTAIL) SUSP suspension, Take 30 mLs by mouth 2 (two) times daily as needed for indigestion (abd pain). Shake well. Each dose to containe 25m maalox and 161mviscous lidocaine and 57m1monnatal, Disp: 300 mL, Rfl: 0   amLODipine (NORVASC) 5 MG tablet, Take 1 tablet by  mouth once daily, Disp: 90 tablet, Rfl: 0   aspirin EC 81 MG tablet, Take 81 mg by mouth daily. , Disp: , Rfl:    BREO ELLIPTA 200-25 MCG/INH AEPB, Inhale 1 puff by mouth once daily, Disp: 60 each, Rfl: 5   DULoxetine (CYMBALTA) 20 MG capsule, Take 1 capsule (20 mg total) by mouth daily., Disp: 30 capsule, Rfl: 3   gabapentin (NEURONTIN) 100 MG capsule, TAKE 1 CAPSULE BY MOUTH THREE TIMES DAILY, Disp: 90 capsule, Rfl: 0   levothyroxine (SYNTHROID) 100 MCG tablet, TAKE 1 TABLET BY MOUTH ONCE DAILY BEFORE BREAKFAST, Disp: 90 tablet, Rfl: 0   lisinopril-hydrochlorothiazide (ZESTORETIC) 20-25 MG tablet, Take 1 tablet by mouth once daily, Disp: 90 tablet, Rfl: 0   omeprazole (PRILOSEC) 40 MG capsule, Take 1 capsule (40 mg total) by mouth daily., Disp: 90 capsule, Rfl: 3   simvastatin (ZOCOR) 40 MG tablet, TAKE 1 TABLET BY MOUTH AT BEDTIME, Disp: 90 tablet, Rfl: 0    Allergies  Allergen Reactions   Codeine Nausea Only    ROS Review of Systems  Constitutional: Negative.   HENT: Negative.    Eyes: Negative.   Respiratory: Negative.    Cardiovascular: Negative.   Gastrointestinal: Negative.   Endocrine: Negative.   Genitourinary: Negative.   Musculoskeletal:  Positive for back pain. Negative for gait problem, joint swelling, myalgias and neck pain.  Skin: Negative.   Allergic/Immunologic: Negative.   Neurological: Negative.   Hematological: Negative.   Psychiatric/Behavioral: Negative.    All other systems reviewed and are negative.    Objective:    Physical Exam Vitals reviewed.  Constitutional:      Appearance: Normal appearance.  HENT:     Mouth/Throat:     Mouth: Mucous membranes are moist.  Eyes:     Pupils: Pupils are equal, round, and reactive to light.  Neck:     Vascular: No carotid bruit.  Cardiovascular:     Rate and Rhythm: Normal rate and regular rhythm.     Pulses: Normal pulses.     Heart sounds: Normal heart sounds.  Pulmonary:     Effort: Pulmonary effort is normal.     Breath sounds: Normal breath sounds.  Abdominal:     General: Bowel sounds are normal.     Palpations: Abdomen is soft. There is no hepatomegaly, splenomegaly or mass.     Tenderness: There is no abdominal tenderness.     Hernia: No hernia is present.  Genitourinary:    Vagina: No vaginal discharge.  Musculoskeletal:        General: No tenderness.     Cervical back: Neck supple.     Right lower leg: No edema.     Left lower leg: No edema.     Comments: Painful lt lumbo sacral area  Skin:    Findings: No rash.  Neurological:     Mental Status: She is alert and oriented to person, place, and time.     Motor: No weakness.  Psychiatric:        Mood and Affect: Mood and affect normal.        Behavior: Behavior normal.    BP 140/80    Pulse 78    Ht 5' 2" (1.575 m)    Wt 134 lb 14.4 oz (61.2 kg)    BMI 24.67 kg/m  Wt Readings from  Last 3 Encounters:  11/18/21 134 lb 14.4 oz (61.2 kg)  10/05/21 130 lb 14.4 oz (59.4 kg)  10/02/21 128 lb (  58.1 kg)     Health Maintenance Due  Topic Date Due   Zoster Vaccines- Shingrix (1 of 2) Never done   Pneumonia Vaccine 56+ Years old (2 - PCV) 10/20/2006   COVID-19 Vaccine (5 - Booster for Pfizer series) 06/16/2021    There are no preventive care reminders to display for this patient.  Lab Results  Component Value Date   TSH 5.20 (H) 09/15/2021   Lab Results  Component Value Date   WBC 6.1 09/15/2021   HGB 12.0 09/15/2021   HCT 35.3 09/15/2021   MCV 95.1 09/15/2021   PLT 246 09/15/2021   Lab Results  Component Value Date   NA 138 09/15/2021   K 4.4 09/15/2021   CO2 21 09/15/2021   GLUCOSE 83 09/15/2021   BUN 26 (H) 09/15/2021   CREATININE 1.60 (H) 09/15/2021   BILITOT 0.4 09/15/2021   ALKPHOS 44 11/30/2015   AST 13 09/15/2021   ALT 6 09/15/2021   PROT 6.6 09/15/2021   ALBUMIN 4.1 11/30/2015   CALCIUM 10.9 (H) 09/15/2021   ANIONGAP 6 05/04/2018   EGFR 31 (L) 09/15/2021   Lab Results  Component Value Date   CHOL 309 (H) 09/15/2021   Lab Results  Component Value Date   HDL 44 (L) 09/15/2021   Lab Results  Component Value Date   LDLCALC 219 (H) 09/15/2021   Lab Results  Component Value Date   TRIG 246 (H) 09/15/2021   Lab Results  Component Value Date   CHOLHDL 7.0 (H) 09/15/2021   No results found for: HGBA1C    Assessment & Plan:   Problem List Items Addressed This Visit       Cardiovascular and Mediastinum   Essential hypertension, benign      Patient denies any chest pain or shortness of breath there is no history of palpitation or paroxysmal nocturnal dyspnea   patient was advised to follow low-salt low-cholesterol diet    ideally I want to keep systolic blood pressure below 130 mmHg, patient was asked to check blood pressure one times a week and give me a report on that.  Patient will be follow-up in 3 months  or earlier as  needed, patient will call me back for any change in the cardiovascular symptoms Patient was advised to buy a book from local bookstore concerning blood pressure and read several chapters  every day.  This will be supplemented by some of the material we will give him from the office.  Patient should also utilize other resources like YouTube and Internet to learn more about the blood pressure and the diet.        Nervous and Auditory   Left-sided low back pain with left-sided sciatica - Primary    Patient was given Kenalog shot on the left side        Other   Anxiety disorder    Stable at the present time      Right shoulder pain   Hyperlipidemia    Hypercholesterolemia  I advised the patient to follow Mediterranean diet This diet is rich in fruits vegetables and whole grain, and This diet is also rich in fish and lean meat Patient should also eat a handful of almonds or walnuts daily Recent heart study indicated that average follow-up on this kind of diet reduces the cardiovascular mortality by 50 to 70%==       No orders of the defined types were placed in this encounter.   Follow-up: No follow-ups on file.  Cletis Athens, MD

## 2021-11-18 NOTE — Assessment & Plan Note (Signed)
Hypercholesterolemia  I advised the patient to follow Mediterranean diet This diet is rich in fruits vegetables and whole grain, and This diet is also rich in fish and lean meat Patient should also eat a handful of almonds or walnuts daily Recent heart study indicated that average follow-up on this kind of diet reduces the cardiovascular mortality by 50 to 70%== 

## 2021-11-18 NOTE — Assessment & Plan Note (Signed)
Patient was given Kenalog shot on the left side

## 2021-11-18 NOTE — Assessment & Plan Note (Signed)
Stable at the present time. 

## 2021-11-18 NOTE — Assessment & Plan Note (Signed)

## 2021-11-19 ENCOUNTER — Other Ambulatory Visit: Payer: Self-pay | Admitting: Internal Medicine

## 2021-11-19 DIAGNOSIS — R5383 Other fatigue: Secondary | ICD-10-CM

## 2021-11-19 DIAGNOSIS — R5381 Other malaise: Secondary | ICD-10-CM

## 2021-12-02 ENCOUNTER — Other Ambulatory Visit: Payer: Self-pay | Admitting: *Deleted

## 2021-12-02 DIAGNOSIS — F419 Anxiety disorder, unspecified: Secondary | ICD-10-CM

## 2021-12-02 MED ORDER — ALPRAZOLAM 0.25 MG PO TABS: 0.2500 mg | ORAL_TABLET | Freq: Two times a day (BID) | ORAL | 0 refills | Status: DC

## 2021-12-28 ENCOUNTER — Other Ambulatory Visit: Payer: Self-pay | Admitting: Internal Medicine

## 2022-01-08 ENCOUNTER — Other Ambulatory Visit: Payer: Self-pay

## 2022-01-18 ENCOUNTER — Encounter: Payer: Self-pay | Admitting: Internal Medicine

## 2022-01-18 ENCOUNTER — Ambulatory Visit (INDEPENDENT_AMBULATORY_CARE_PROVIDER_SITE_OTHER): Payer: Medicare Other | Admitting: Internal Medicine

## 2022-01-18 ENCOUNTER — Other Ambulatory Visit: Payer: Self-pay

## 2022-01-18 VITALS — BP 140/86 | HR 73 | Ht 62.0 in | Wt 134.3 lb

## 2022-01-18 DIAGNOSIS — J219 Acute bronchiolitis, unspecified: Secondary | ICD-10-CM

## 2022-01-18 DIAGNOSIS — J069 Acute upper respiratory infection, unspecified: Secondary | ICD-10-CM | POA: Diagnosis not present

## 2022-01-18 DIAGNOSIS — F419 Anxiety disorder, unspecified: Secondary | ICD-10-CM | POA: Diagnosis not present

## 2022-01-18 DIAGNOSIS — E785 Hyperlipidemia, unspecified: Secondary | ICD-10-CM

## 2022-01-18 DIAGNOSIS — I10 Essential (primary) hypertension: Secondary | ICD-10-CM | POA: Diagnosis not present

## 2022-01-18 LAB — POC COVID19 BINAXNOW: SARS Coronavirus 2 Ag: NEGATIVE

## 2022-01-18 MED ORDER — AZITHROMYCIN 250 MG PO TABS: ORAL_TABLET | ORAL | 0 refills | Status: AC

## 2022-01-18 MED ORDER — ALPRAZOLAM 0.25 MG PO TABS: 0.2500 mg | ORAL_TABLET | Freq: Two times a day (BID) | ORAL | 0 refills | Status: DC

## 2022-01-18 NOTE — Assessment & Plan Note (Signed)
Hypercholesterolemia  I advised the patient to follow Mediterranean diet This diet is rich in fruits vegetables and whole grain, and This diet is also rich in fish and lean meat Patient should also eat a handful of almonds or walnuts daily Recent heart study indicated that average follow-up on this kind of diet reduces the cardiovascular mortality by 50 to 70%== 

## 2022-01-18 NOTE — Progress Notes (Signed)
Established Patient Office Visit  Subjective:  Patient ID: Gail Mora, female    DOB: Aug 19, 1933  Age: 88 y.o. MRN: [id number]  CC:  Chief Complaint  Patient presents with   URI    URI  Associated symptoms include congestion.   Gail Mora presents for uri Past Medical History:  Diagnosis Date   GERD (gastroesophageal reflux disease)    Hyperlipemia    Hypertension    Hypothyroid     Past Surgical History:  Procedure Laterality Date   ABDOMINAL HYSTERECTOMY     BACK SURGERY     CATARACT EXTRACTION     Knee replacemt  2015   Left knee   RETINAL TEAR REPAIR CRYOTHERAPY      Family History  Problem Relation Age of Onset   Bladder Cancer Neg Hx    Kidney cancer Neg Hx     Social History   Socioeconomic History   Marital status: Married    Spouse name: Not on file   Number of children: Not on file   Years of education: Not on file   Highest education level: Not on file  Occupational History   Not on file  Tobacco Use   Smoking status: Former   Smokeless tobacco: Never  Vaping Use   Vaping Use: Never used  Substance and Sexual Activity   Alcohol use: No   Drug use: No   Sexual activity: Not Currently  Other Topics Concern   Not on file  Social History Narrative   Not on file   Social Determinants of Health   Financial Resource Strain: Low Risk    Difficulty of Paying Living Expenses: Not very hard  Food Insecurity: No Food Insecurity   Worried About Running Out of Food in the Last Year: Never true   Ran Out of Food in the Last Year: Never true  Transportation Needs: No Transportation Needs   Lack of Transportation (Medical): No   Lack of Transportation (Non-Medical): No  Physical Activity: Insufficiently Active   Days of Exercise per Week: 2 days   Minutes of Exercise per Session: 20 min  Stress: Stress Concern Present   Feeling of Stress : To some extent  Social Connections: Unknown   Frequency of Communication with Friends and Family:  More than three times a week   Frequency of Social Gatherings with Friends and Family: More than three times a week   Attends Religious Services: Not on file   Active Member of Clubs or Organizations: Yes   Attends Archivist Meetings: More than 4 times per year   Marital Status: Widowed  Human resources officer Violence: Not At Risk   Fear of Current or Ex-Partner: No   Emotionally Abused: No   Physically Abused: No   Sexually Abused: No     Current Outpatient Medications:    allopurinol (ZYLOPRIM) 100 MG tablet, Take 1 tablet (100 mg total) by mouth daily., Disp: 90 tablet, Rfl: 1   ALPRAZolam (XANAX) 0.25 MG tablet, Take 1 tablet (0.25 mg total) by mouth 2 (two) times daily., Disp: 60 tablet, Rfl: 0   Alum & Mag Hydroxide-Simeth (GI COCKTAIL) SUSP suspension, Take 30 mLs by mouth 2 (two) times daily as needed for indigestion (abd pain). Shake well. Each dose to containe 67m maalox and 173mviscous lidocaine and 76m15monnatal, Disp: 300 mL, Rfl: 0   amLODipine (NORVASC) 5 MG tablet, Take 1 tablet by mouth once daily, Disp: 90 tablet, Rfl: 0   aspirin EC  81 MG tablet, Take 81 mg by mouth daily. , Disp: , Rfl:    BREO ELLIPTA 200-25 MCG/ACT AEPB, Inhale 1 puff by mouth once daily, Disp: 180 each, Rfl: 0   DULoxetine (CYMBALTA) 20 MG capsule, Take 1 capsule (20 mg total) by mouth daily., Disp: 30 capsule, Rfl: 3   gabapentin (NEURONTIN) 100 MG capsule, Take 1 capsule (100 mg total) by mouth as directed. Take one capsule in am and 1 capsule at lunch and 2 capsules at bedtime., Disp: 360 capsule, Rfl: 3   levothyroxine (SYNTHROID) 100 MCG tablet, TAKE 1 TABLET BY MOUTH ONCE DAILY BEFORE BREAKFAST, Disp: 90 tablet, Rfl: 0   lisinopril-hydrochlorothiazide (ZESTORETIC) 20-25 MG tablet, Take 1 tablet by mouth once daily, Disp: 90 tablet, Rfl: 0   omeprazole (PRILOSEC) 40 MG capsule, Take 1 capsule (40 mg total) by mouth daily., Disp: 90 capsule, Rfl: 3   simvastatin (ZOCOR) 40 MG tablet, TAKE 1  TABLET BY MOUTH AT BEDTIME, Disp: 90 tablet, Rfl: 0   Allergies  Allergen Reactions   Codeine Nausea Only    ROS Review of Systems  Constitutional: Negative.  Negative for chills and fatigue.  HENT:  Positive for congestion. Negative for nosebleeds.   Eyes: Negative.   Respiratory: Negative.    Cardiovascular: Negative.   Gastrointestinal: Negative.   Endocrine: Negative.   Genitourinary: Negative.   Musculoskeletal: Negative.   Skin: Negative.   Allergic/Immunologic: Negative.   Neurological: Negative.   Hematological: Negative.   Psychiatric/Behavioral: Negative.    All other systems reviewed and are negative.    Objective:    Physical Exam Vitals reviewed.  Constitutional:      Appearance: Normal appearance.  HENT:     Mouth/Throat:     Mouth: Mucous membranes are moist.  Eyes:     Pupils: Pupils are equal, round, and reactive to light.  Neck:     Vascular: No carotid bruit.  Cardiovascular:     Rate and Rhythm: Normal rate and regular rhythm.     Pulses: Normal pulses.     Heart sounds: Normal heart sounds.  Pulmonary:     Effort: Pulmonary effort is normal.     Breath sounds: Normal breath sounds.  Abdominal:     General: Bowel sounds are normal.     Palpations: Abdomen is soft. There is no hepatomegaly, splenomegaly or mass.     Tenderness: There is no abdominal tenderness.     Hernia: No hernia is present.  Musculoskeletal:        General: No tenderness.     Cervical back: Neck supple.     Right lower leg: No edema.     Left lower leg: No edema.  Skin:    Findings: No rash.  Neurological:     Mental Status: She is alert and oriented to person, place, and time.     Motor: No weakness.  Psychiatric:        Mood and Affect: Mood and affect normal.        Behavior: Behavior normal.    BP 140/86    Pulse 73    Ht _0  (1.575 m)    Wt 134 lb 4.8 oz (60.9 kg)    BMI 24.56 kg/m  Wt Readings from Last 3 Encounters:  01/18/22 134 lb 4.8 oz (60.9 kg)   11/18/21 134 lb 14.4 oz (61.2 kg)  10/05/21 130 lb 14.4 oz (59.4 kg)     Health Maintenance Due  Topic Date Due  Zoster Vaccines- Shingrix (1 of 2) Never done   Pneumonia Vaccine 69+ Years old (2 - PCV) 10/20/2006   COVID-19 Vaccine (5 - Booster for Pfizer series) 06/16/2021    There are no preventive care reminders to display for this patient.  Lab Results  Component Value Date   TSH 5.20 (H) 09/15/2021   Lab Results  Component Value Date   WBC 6.1 09/15/2021   HGB 12.0 09/15/2021   HCT 35.3 09/15/2021   MCV 95.1 09/15/2021   PLT 246 09/15/2021   Lab Results  Component Value Date   NA 138 09/15/2021   K 4.4 09/15/2021   CO2 21 09/15/2021   GLUCOSE 83 09/15/2021   BUN 26 (H) 09/15/2021   CREATININE 1.60 (H) 09/15/2021   BILITOT 0.4 09/15/2021   ALKPHOS 44 11/30/2015   AST 13 09/15/2021   ALT 6 09/15/2021   PROT 6.6 09/15/2021   ALBUMIN 4.1 11/30/2015   CALCIUM 10.9 (H) 09/15/2021   ANIONGAP 6 05/04/2018   EGFR 31 (L) 09/15/2021   Lab Results  Component Value Date   CHOL 309 (H) 09/15/2021   Lab Results  Component Value Date   HDL 44 (L) 09/15/2021   Lab Results  Component Value Date   LDLCALC 219 (H) 09/15/2021   Lab Results  Component Value Date   TRIG 246 (H) 09/15/2021   Lab Results  Component Value Date   CHOLHDL 7.0 (H) 09/15/2021   No results found for: HGBA1C    Assessment & Plan:   Problem List Items Addressed This Visit       Cardiovascular and Mediastinum   Essential hypertension, benign     Patient denies any chest pain or shortness of breath there is no history of palpitation or paroxysmal nocturnal dyspnea   patient was advised to follow low-salt low-cholesterol diet    ideally I want to keep systolic blood pressure below 130 mmHg, patient was asked to check blood pressure one times a week and give me a report on that.  Patient will be follow-up in 3 months  or earlier as needed, patient will call me back for any change in  the cardiovascular symptoms Patient was advised to buy a book from local bookstore concerning blood pressure and read several chapters  every day.  This will be supplemented by some of the material we will give him from the office.  Patient should also utilize other resources like YouTube and Internet to learn more about the blood pressure and the diet.        Respiratory   Bronchiolitis    Patient was started on Z-Pak      Viral upper respiratory tract infection - Primary    Started on Z-Pak      Relevant Orders   POC COVID-19 (Completed)     Other   Anxiety disorder    - Patient experiencing high levels of anxiety.  - Encouraged patient to engage in relaxing activities like yoga, meditation, journaling, going for a walk, or participating in a hobby.  - Encouraged patient to reach out to trusted friends or family members about recent struggles, Patient was advised to read A book, how to stop worrying and start living, it is good book to read to control  the stress       Relevant Medications   ALPRAZolam (XANAX) 0.25 MG tablet   Hyperlipidemia    Hypercholesterolemia  I advised the patient to follow Mediterranean diet This diet is rich in fruits vegetables  and whole grain, and This diet is also rich in fish and lean meat Patient should also eat a handful of almonds or walnuts daily Recent heart study indicated that average follow-up on this kind of diet reduces the cardiovascular mortality by 50 to 70%==       Meds ordered this encounter  Medications   ALPRAZolam (XANAX) 0.25 MG tablet    Sig: Take 1 tablet (0.25 mg total) by mouth 2 (two) times daily.    Dispense:  60 tablet    Refill:  0    Follow-up: No follow-ups on file.    Cletis Athens, MD

## 2022-01-18 NOTE — Assessment & Plan Note (Signed)

## 2022-01-18 NOTE — Assessment & Plan Note (Signed)
Started on Z-Pak 

## 2022-01-18 NOTE — Assessment & Plan Note (Signed)
Patient was started on Z-Pak 

## 2022-01-18 NOTE — Assessment & Plan Note (Signed)
-   Patient experiencing high levels of anxiety.  - Encouraged patient to engage in relaxing activities like yoga, meditation, journaling, going for a walk, or participating in a hobby.  - Encouraged patient to reach out to trusted friends or family members about recent struggles, Patient was advised to read A book, how to stop worrying and start living, it is good book to read to control  the stress  

## 2022-03-05 ENCOUNTER — Other Ambulatory Visit: Payer: Self-pay | Admitting: *Deleted

## 2022-03-05 DIAGNOSIS — F419 Anxiety disorder, unspecified: Secondary | ICD-10-CM

## 2022-03-05 MED ORDER — ALPRAZOLAM 0.25 MG PO TABS: 0.2500 mg | ORAL_TABLET | Freq: Two times a day (BID) | ORAL | 0 refills | Status: DC

## 2022-03-16 ENCOUNTER — Other Ambulatory Visit: Payer: Self-pay | Admitting: Internal Medicine

## 2022-03-16 DIAGNOSIS — R5383 Other fatigue: Secondary | ICD-10-CM

## 2022-03-29 ENCOUNTER — Emergency Department: Payer: Medicare Other

## 2022-03-29 ENCOUNTER — Encounter: Payer: Self-pay | Admitting: Emergency Medicine

## 2022-03-29 ENCOUNTER — Other Ambulatory Visit: Payer: Self-pay

## 2022-03-29 ENCOUNTER — Emergency Department
Admission: EM | Admit: 2022-03-29 | Discharge: 2022-03-29 | Disposition: A | Discharge status: Home / Self Care | Payer: Medicare Other | Attending: Emergency Medicine | Admitting: Emergency Medicine

## 2022-03-29 DIAGNOSIS — I10 Essential (primary) hypertension: Secondary | ICD-10-CM | POA: Insufficient documentation

## 2022-03-29 DIAGNOSIS — R0789 Other chest pain: Secondary | ICD-10-CM | POA: Insufficient documentation

## 2022-03-29 DIAGNOSIS — R079 Chest pain, unspecified: Secondary | ICD-10-CM | POA: Diagnosis not present

## 2022-03-29 LAB — BASIC METABOLIC PANEL
Anion gap: 7 (ref 5–15)
BUN: 35 mg/dL — ABNORMAL HIGH (ref 8–23)
CO2: 27 mmol/L (ref 22–32)
Calcium: 10.8 mg/dL — ABNORMAL HIGH (ref 8.9–10.3)
Chloride: 103 mmol/L (ref 98–111)
Creatinine, Ser: 1.41 mg/dL — ABNORMAL HIGH (ref 0.44–1.00)
GFR, Estimated: 36 mL/min — ABNORMAL LOW (ref >60)
Glucose, Bld: 104 mg/dL — ABNORMAL HIGH (ref 70–99)
Potassium: 4.7 mmol/L (ref 3.5–5.1)
Sodium: 137 mmol/L (ref 135–145)

## 2022-03-29 LAB — CBC
HCT: 37.2 % (ref 36.0–46.0)
Hemoglobin: 12.3 g/dL (ref 12.0–15.0)
MCH: 31.6 pg (ref 26.0–34.0)
MCHC: 33.1 g/dL (ref 30.0–36.0)
MCV: 95.6 fL (ref 80.0–100.0)
Platelets: 256 10*3/uL (ref 150–400)
RBC: 3.89 MIL/uL (ref 3.87–5.11)
RDW: 12.6 % (ref 11.5–15.5)
WBC: 7.4 10*3/uL (ref 4.0–10.5)
nRBC: 0 % (ref 0.0–0.2)

## 2022-03-29 LAB — TROPONIN I (HIGH SENSITIVITY)
Troponin I (High Sensitivity): 15 ng/L (ref <18)
Troponin I (High Sensitivity): 16 ng/L (ref <18)

## 2022-03-29 MED ORDER — ACETAMINOPHEN 500 MG PO TABS
1000.0000 mg | ORAL_TABLET | Freq: Once | ORAL | Status: AC
  Administered 2022-03-29: 1000 mg via ORAL
  Filled 2022-03-29: qty 2

## 2022-03-29 MED ORDER — LIDOCAINE 5 % EX PTCH
1.0000 | MEDICATED_PATCH | CUTANEOUS | Status: DC
  Administered 2022-03-29: 1 via TRANSDERMAL
  Filled 2022-03-29: qty 1

## 2022-03-29 NOTE — ED Provider Notes (Signed)
? ?[facility] ?Provider Note ? ? ? Event Date/Time  ? First MD Initiated Contact with Patient 03/29/22 1820   ?  (approximate) ? ? ?History  ? ?Chief Complaint ?Chest Pain ? ? ?HPI ? ?Gail Mora is a 88 y.o. female with past medical history of hypertension, hyperlipidemia, GERD, and anxiety who presents to the ED complaining of chest pain.  Patient reports that she has been dealing with intermittent pain in her chest today that she describes as a dull ache.  Pain radiates upwards to both shoulders as well as both sides of her upper back, seems to be exacerbated with certain twisting motions and other movements.  She denies any associated fevers, cough, shortness of breath, pain or swelling in her legs.  She states she has dealt with similar symptoms in the past, but it usually goes away when she either takes medication for anxiety or reflux, has not done so today.  She denies any significant cardiac history. ?  ? ? ?Physical Exam  ? ?Triage Vital Signs: ?ED Triage Vitals  ?Enc Vitals Group  ?   BP 03/29/22 1724 (!) 183/72  ?   Pulse Rate 03/29/22 1724 70  ?   Resp 03/29/22 1724 16  ?   Temp 03/29/22 1724 97.9 ?F (36.6 ?C)  ?   Temp Source 03/29/22 1724 Oral  ?   SpO2 03/29/22 1724 96 %  ?   Weight 03/29/22 1725 136 lb (61.7 kg)  ?   Height 03/29/22 1725 5\' 2"  (1.575 m)  ?   Head Circumference --   ?   Peak Flow --   ?   Pain Score 03/29/22 1724 5  ?   Pain Loc --   ?   Pain Edu? --   ?   Excl. in [place]? --   ? ? ?Most recent vital signs: ?Vitals:  ? 03/29/22 1724 03/29/22 1938  ?BP: (!) 183/72 (!) 173/67  ?Pulse: 70 63  ?Resp: 16 17  ?Temp: 97.9 ?F (36.6 ?C)   ?SpO2: 96% 100%  ? ? ?Constitutional: Alert and oriented. ?Eyes: Conjunctivae are normal. ?Head: Atraumatic. ?Nose: No congestion/rhinnorhea. ?Mouth/Throat: Mucous membranes are moist.  ?Neck: No midline cervical spine tenderness to palpation. ?Cardiovascular: Normal rate, regular rhythm. Grossly normal heart sounds.  2+ radial pulses  bilaterally. ?Respiratory: Normal respiratory effort.  No retractions. Lungs CTAB.  Left chest wall tenderness to palpation noted. ?Gastrointestinal: Soft and nontender. No distention. ?Musculoskeletal: No lower extremity tenderness nor edema.  Tenderness to palpation noted over bilateral trapezius muscles.  No midline thoracic or lumbar spinal tenderness to palpation. ?Neurologic:  Normal speech and language. No gross focal neurologic deficits are appreciated. ? ? ? ?ED Results / Procedures / Treatments  ? ?Labs ?(all labs ordered are listed, but only abnormal results are displayed) ?Labs Reviewed  ?BASIC METABOLIC PANEL - Abnormal; Notable for the following components:  ?    Result Value  ? Glucose, Bld 104 (*)   ? BUN 35 (*)   ? Creatinine, Ser 1.41 (*)   ? Calcium 10.8 (*)   ? GFR, Estimated 36 (*)   ? All other components within normal limits  ?CBC  ?TROPONIN I (HIGH SENSITIVITY)  ?TROPONIN I (HIGH SENSITIVITY)  ? ? ? ?EKG ? ?ED ECG REPORT ?Tempie Hoist, the attending physician, personally viewed and interpreted this ECG. ? ? Date: 03/29/2022 ? EKG Time: 17:20 ? Rate: 71 ? Rhythm: normal sinus rhythm ? Axis: Normal ? Intervals:none ?  ST&T Change: None ? ?RADIOLOGY ?Chest x-ray reviewed and interpreted by me with no infiltrate, edema, or effusion. ? ?PROCEDURES: ? ?Critical Care performed: No ? ?Procedures ? ? ?MEDICATIONS ORDERED IN ED: ?Medications  ?lidocaine (LIDODERM) 5 % 1 patch (1 patch Transdermal Patch Applied 03/29/22 1929)  ?acetaminophen (TYLENOL) tablet 1,000 mg (1,000 mg Oral Given 03/29/22 1931)  ? ? ? ?IMPRESSION / MDM / ASSESSMENT AND PLAN / ED COURSE  ?I reviewed the triage vital signs and the nursing notes. ?             ?               ? ?88 y.o. female with past medical history of hypertension, hyperlipidemia, GERD, and anxiety who presents to the ED complaining of intermittent dull aching pain in her chest since this morning that radiates upwards to both shoulders and into her upper  back. ? ?Differential diagnosis includes, but is not limited to, ACS, PE, dissection, pneumonia, pneumothorax, musculoskeletal pain, reflux, and anxiety. ? ?Patient well-appearing and in no acute distress, vital signs are unremarkable and EKG shows no evidence of arrhythmia or ischemia.  Symptoms are very atypical for ACS given they are worse with certain changes in position and also reproduced with palpation of her chest.  Initial troponin is negative and I would favor musculoskeletal etiology for her pain.  Given intermittent symptoms starting today, we will check repeat troponin, but remainder of labs are unremarkable.  BMP shows similar chronic kidney disease with no electrolyte abnormality, CBC without anemia or leukocytosis.  Chest x-ray is also unremarkable.  Plan to treat symptomatically with Lidoderm patch and Tylenol, reassess following repeat troponin. ? ?Repeat troponin within normal limits, patient reports pain is improved following Lidoderm patch and Tylenol.  She is appropriate for discharge home with PCP follow-up, was counseled to return to the ED for new worsening symptoms.  Patient and family agree with plan. ? ?  ? ? ?FINAL CLINICAL IMPRESSION(S) / ED DIAGNOSES  ? ?Final diagnoses:  ?Nonspecific chest pain  ? ? ? ?Rx / DC Orders  ? ?ED Discharge Orders   ? ? None  ? ?  ? ? ? ?Note:  This document was prepared using Dragon voice recognition software and may include unintentional dictation errors. ?  ?Blake Divine, MD ?03/29/22 2018 ? ?

## 2022-03-29 NOTE — ED Triage Notes (Signed)
Pt to ED via POV stating that she has been having chest pain today on and off today that will not go away. Pt states that she is having pain across her shoulder, in her neck and down her arm. Pt states that this normally goes away when she takes her medication but today it has not went away. Pt states that her blood pressure has been running high on and off for a while.  ?

## 2022-03-30 ENCOUNTER — Encounter: Payer: Self-pay | Admitting: Internal Medicine

## 2022-03-30 ENCOUNTER — Ambulatory Visit (INDEPENDENT_AMBULATORY_CARE_PROVIDER_SITE_OTHER): Payer: Medicare Other | Admitting: Internal Medicine

## 2022-03-30 VITALS — BP 133/60 | HR 69 | Ht 62.0 in | Wt 137.8 lb

## 2022-03-30 DIAGNOSIS — K219 Gastro-esophageal reflux disease without esophagitis: Secondary | ICD-10-CM

## 2022-03-30 DIAGNOSIS — I10 Essential (primary) hypertension: Secondary | ICD-10-CM

## 2022-03-30 DIAGNOSIS — E785 Hyperlipidemia, unspecified: Secondary | ICD-10-CM

## 2022-03-30 DIAGNOSIS — F419 Anxiety disorder, unspecified: Secondary | ICD-10-CM | POA: Diagnosis not present

## 2022-03-30 DIAGNOSIS — M25511 Pain in right shoulder: Secondary | ICD-10-CM | POA: Diagnosis not present

## 2022-03-30 MED ORDER — PANTOPRAZOLE SODIUM 40 MG PO TBEC: 40.0000 mg | DELAYED_RELEASE_TABLET | Freq: Every day | ORAL | 3 refills | Status: DC

## 2022-03-30 NOTE — Assessment & Plan Note (Signed)
Patient educated extensively on acid reflux lifestyle modification, including buying a bed wedge, not eating 3 hrs before bedtime, diet modifications, and handout given for the same.  

## 2022-03-30 NOTE — Progress Notes (Signed)
? ?Established Patient Office Visit ? ?Subjective:  ?Patient ID: Gail Mora, female    DOB: 04/20/1933  Age: 88 y.o. MRN: [id number] ? ?CC:  ?Chief Complaint  ?Patient presents with  ? Follow-up  ?  Patient here today for ED visit. Patient having problems with heart burn. Patient did have ekg and xrays that were normal. Patient feels that her omeprazole is no longer working.   ? ? ?Gastroesophageal Reflux ?She complains of chest pain and heartburn. She reports no hoarse voice, no nausea, no water brash or no wheezing.  ? ?Gail Mora presents for gerd ? ?Past Medical History:  ?Diagnosis Date  ? GERD (gastroesophageal reflux disease)   ? Hyperlipemia   ? Hypertension   ? Hypothyroid   ? ? ?Past Surgical History:  ?Procedure Laterality Date  ? ABDOMINAL HYSTERECTOMY    ? BACK SURGERY    ? CATARACT EXTRACTION    ? Knee replacemt  2015  ? Left knee  ? RETINAL TEAR REPAIR CRYOTHERAPY    ? ? ?Family History  ?Problem Relation Age of Onset  ? Bladder Cancer Neg Hx   ? Kidney cancer Neg Hx   ? ? ?Social History  ? ?Socioeconomic History  ? Marital status: Married  ?  Spouse name: Not on file  ? Number of children: Not on file  ? Years of education: Not on file  ? Highest education level: Not on file  ?Occupational History  ? Not on file  ?Tobacco Use  ? Smoking status: Former  ? Smokeless tobacco: Never  ?Vaping Use  ? Vaping Use: Never used  ?Substance and Sexual Activity  ? Alcohol use: No  ? Drug use: No  ? Sexual activity: Not Currently  ?Other Topics Concern  ? Not on file  ?Social History Narrative  ? Not on file  ? ?Social Determinants of Health  ? ?Financial Resource Strain: Low Risk   ? Difficulty of Paying Living Expenses: Not very hard  ?Food Insecurity: No Food Insecurity  ? Worried About Charity fundraiser in the Last Year: Never true  ? Ran Out of Food in the Last Year: Never true  ?Transportation Needs: No Transportation Needs  ? Lack of Transportation (Medical): No  ? Lack of Transportation  (Non-Medical): No  ?Physical Activity: Insufficiently Active  ? Days of Exercise per Week: 2 days  ? Minutes of Exercise per Session: 20 min  ?Stress: Stress Concern Present  ? Feeling of Stress : To some extent  ?Social Connections: Unknown  ? Frequency of Communication with Friends and Family: More than three times a week  ? Frequency of Social Gatherings with Friends and Family: More than three times a week  ? Attends Religious Services: Not on file  ? Active Member of Clubs or Organizations: Yes  ? Attends Archivist Meetings: More than 4 times per year  ? Marital Status: Widowed  ?Intimate Partner Violence: Not At Risk  ? Fear of Current or Ex-Partner: No  ? Emotionally Abused: No  ? Physically Abused: No  ? Sexually Abused: No  ? ? ? ?Current Outpatient Medications:  ?  allopurinol (ZYLOPRIM) 100 MG tablet, Take 1 tablet (100 mg total) by mouth daily., Disp: 90 tablet, Rfl: 1 ?  ALPRAZolam (XANAX) 0.25 MG tablet, Take 1 tablet (0.25 mg total) by mouth 2 (two) times daily., Disp: 60 tablet, Rfl: 0 ?  Alum & Mag Hydroxide-Simeth (GI COCKTAIL) SUSP suspension, Take 30 mLs by mouth 2 (two)  times daily as needed for indigestion (abd pain). Shake well. Each dose to containe 87m maalox and 152mviscous lidocaine and 28m32monnatal, Disp: 300 mL, Rfl: 0 ?  amLODipine (NORVASC) 5 MG tablet, Take 1 tablet by mouth once daily, Disp: 90 tablet, Rfl: 0 ?  aspirin EC 81 MG tablet, Take 81 mg by mouth daily. , Disp: , Rfl:  ?  BREO ELLIPTA 200-25 MCG/ACT AEPB, Inhale 1 puff by mouth once daily, Disp: 180 each, Rfl: 0 ?  DULoxetine (CYMBALTA) 20 MG capsule, Take 1 capsule (20 mg total) by mouth daily., Disp: 30 capsule, Rfl: 3 ?  gabapentin (NEURONTIN) 100 MG capsule, Take 1 capsule (100 mg total) by mouth as directed. Take one capsule in am and 1 capsule at lunch and 2 capsules at bedtime., Disp: 360 capsule, Rfl: 3 ?  levothyroxine (SYNTHROID) 100 MCG tablet, TAKE 1 TABLET BY MOUTH ONCE DAILY BEFORE BREAKFAST,  Disp: 90 tablet, Rfl: 0 ?  lisinopril-hydrochlorothiazide (ZESTORETIC) 20-25 MG tablet, Take 1 tablet by mouth once daily, Disp: 90 tablet, Rfl: 0 ?  simvastatin (ZOCOR) 40 MG tablet, TAKE 1 TABLET BY MOUTH AT BEDTIME, Disp: 90 tablet, Rfl: 0  ? ?Allergies  ?Allergen Reactions  ? Codeine Nausea Only  ? ? ?ROS ?Review of Systems  ?HENT:  Negative for hoarse voice.   ?Respiratory:  Negative for wheezing.   ?Cardiovascular:  Positive for chest pain.  ?Gastrointestinal:  Positive for heartburn. Negative for nausea.  ? ?  ?Objective:  ?  ?Physical Exam ?Vitals reviewed.  ?Constitutional:   ?   Appearance: Normal appearance.  ?HENT:  ?   Mouth/Throat:  ?   Mouth: Mucous membranes are moist.  ?Eyes:  ?   Pupils: Pupils are equal, round, and reactive to light.  ?Neck:  ?   Vascular: No carotid bruit.  ?Cardiovascular:  ?   Rate and Rhythm: Normal rate and regular rhythm.  ?   Pulses: Normal pulses.  ?   Heart sounds: Normal heart sounds. No murmur heard. ?  No friction rub. No gallop.  ?Pulmonary:  ?   Effort: Pulmonary effort is normal.  ?   Breath sounds: Normal breath sounds. No wheezing.  ?Abdominal:  ?   General: Bowel sounds are normal. There is no distension.  ?   Palpations: Abdomen is soft. There is no hepatomegaly, splenomegaly or mass.  ?   Tenderness: There is no abdominal tenderness.  ?   Hernia: No hernia is present.  ?Musculoskeletal:     ?   General: No tenderness.  ?   Cervical back: Neck supple.  ?   Right lower leg: No edema.  ?   Left lower leg: No edema.  ?   Comments: Painful movement of the right shoulder  ?Skin: ?   Findings: No rash.  ?Neurological:  ?   General: No focal deficit present.  ?   Mental Status: She is alert and oriented to person, place, and time.  ?   Motor: No weakness.  ?Psychiatric:     ?   Mood and Affect: Mood and affect normal.     ?   Behavior: Behavior normal.     ?   Thought Content: Thought content normal.  ? ? ?BP 133/60   Pulse 69   Ht 5' 2"  (1.575 m)   Wt 137 lb 12.8  oz (62.5 kg)   BMI 25.20 kg/m?  ?Wt Readings from Last 3 Encounters:  ?03/30/22 137 lb 12.8 oz (62.5 kg)  ?  03/29/22 136 lb (61.7 kg)  ?01/18/22 134 lb 4.8 oz (60.9 kg)  ? ? ? ?Health Maintenance Due  ?Topic Date Due  ? Zoster Vaccines- Shingrix (1 of 2) Never done  ? Pneumonia Vaccine 52+ Years old (2 - PCV) 10/20/2006  ? COVID-19 Vaccine (5 - Booster for [place] series) 06/16/2021  ? ? ?There are no preventive care reminders to display for this patient. ? ?Lab Results  ?Component Value Date  ? TSH 5.20 (H) 09/15/2021  ? ?Lab Results  ?Component Value Date  ? WBC 7.4 03/29/2022  ? HGB 12.3 03/29/2022  ? HCT 37.2 03/29/2022  ? MCV 95.6 03/29/2022  ? PLT 256 03/29/2022  ? ?Lab Results  ?Component Value Date  ? NA 137 03/29/2022  ? K 4.7 03/29/2022  ? CO2 27 03/29/2022  ? GLUCOSE 104 (H) 03/29/2022  ? BUN 35 (H) 03/29/2022  ? CREATININE 1.41 (H) 03/29/2022  ? BILITOT 0.4 09/15/2021  ? ALKPHOS 44 11/30/2015  ? AST 13 09/15/2021  ? ALT 6 09/15/2021  ? PROT 6.6 09/15/2021  ? ALBUMIN 4.1 11/30/2015  ? CALCIUM 10.8 (H) 03/29/2022  ? ANIONGAP 7 03/29/2022  ? EGFR 31 (L) 09/15/2021  ? ?Lab Results  ?Component Value Date  ? CHOL 309 (H) 09/15/2021  ? ?Lab Results  ?Component Value Date  ? HDL 44 (L) 09/15/2021  ? ?Lab Results  ?Component Value Date  ? [place] 219 (H) 09/15/2021  ? ?Lab Results  ?Component Value Date  ? TRIG 246 (H) 09/15/2021  ? ?Lab Results  ?Component Value Date  ? CHOLHDL 7.0 (H) 09/15/2021  ? ?No results found for: HGBA1C ? ?  ?Assessment & Plan:  ? ?Problem List Items Addressed This Visit   ? ?  ? Cardiovascular and Mediastinum  ? Essential hypertension  ?   Patient denies any chest pain or shortness of breath there is no history of palpitation or paroxysmal nocturnal dyspnea ?  patient was advised to follow low-salt low-cholesterol diet ? ?  ideally I want to keep systolic blood pressure below 130 mmHg, patient was asked to check blood pressure one times a week and give me a report on that.  Patient will  be follow-up in 1/2 months  or earlier as needed, patient will call me back for any change in the cardiovascular symptoms ?  ? ?  ?  ?  ? Digestive  ? Gastroesophageal reflux disease without esophagitis - Primary  ?  Fraser Din

## 2022-03-30 NOTE — Assessment & Plan Note (Signed)
Patient has a right shoulder impingement syndrome, she might need cortisone shot she will come back in  week  ?

## 2022-03-30 NOTE — Assessment & Plan Note (Signed)
Patient denies any chest pain or shortness of breath there is no history of palpitation or paroxysmal nocturnal dyspnea ?  patient was advised to follow low-salt low-cholesterol diet ? ?  ideally I want to keep systolic blood pressure below 829 mmHg, patient was asked to check blood pressure one times a week and give me a report on that.  Patient will be follow-up in 1/2 months  or earlier as needed, patient will call me back for any change in the cardiovascular symptoms ?  ?

## 2022-03-30 NOTE — Assessment & Plan Note (Signed)
-   Patient experiencing high levels of anxiety.  - Encouraged patient to engage in relaxing activities like yoga, meditation, journaling, going for a walk, or participating in a hobby.  - Encouraged patient to reach out to trusted friends or family members about recent struggles, Patient was advised to read A book, how to stop worrying and start living, it is good book to read to control  the stress  

## 2022-03-30 NOTE — Assessment & Plan Note (Signed)
Hypercholesterolemia  I advised the patient to follow Mediterranean diet This diet is rich in fruits vegetables and whole grain, and This diet is also rich in fish and lean meat Patient should also eat a handful of almonds or walnuts daily Recent heart study indicated that average follow-up on this kind of diet reduces the cardiovascular mortality by 50 to 70%== 

## 2022-04-05 ENCOUNTER — Ambulatory Visit (INDEPENDENT_AMBULATORY_CARE_PROVIDER_SITE_OTHER): Payer: Medicare Other | Admitting: Internal Medicine

## 2022-04-05 ENCOUNTER — Encounter: Payer: Self-pay | Admitting: Internal Medicine

## 2022-04-05 VITALS — BP 128/74 | HR 78 | Ht 62.0 in | Wt 137.0 lb

## 2022-04-05 DIAGNOSIS — F419 Anxiety disorder, unspecified: Secondary | ICD-10-CM | POA: Diagnosis not present

## 2022-04-05 DIAGNOSIS — M25511 Pain in right shoulder: Secondary | ICD-10-CM | POA: Diagnosis not present

## 2022-04-05 DIAGNOSIS — K219 Gastro-esophageal reflux disease without esophagitis: Secondary | ICD-10-CM

## 2022-04-05 DIAGNOSIS — I10 Essential (primary) hypertension: Secondary | ICD-10-CM | POA: Diagnosis not present

## 2022-04-05 DIAGNOSIS — G8929 Other chronic pain: Secondary | ICD-10-CM

## 2022-04-05 NOTE — Assessment & Plan Note (Signed)
Stable

## 2022-04-05 NOTE — Assessment & Plan Note (Signed)
Right shoulder was injected with lidocaine and cortisone, patient tolerated the procedure well

## 2022-04-05 NOTE — Assessment & Plan Note (Signed)
Blood pressure was found to be elevated on today's examination

## 2022-04-05 NOTE — Assessment & Plan Note (Signed)
Patient educated extensively on acid reflux lifestyle modification, including buying a bed wedge, not eating 3 hrs before bedtime, diet modifications, and handout given for the same.  

## 2022-04-05 NOTE — Progress Notes (Signed)
Established Patient Office Visit  Subjective:  Patient ID: Gail Mora, female    DOB: [birth year]  Age: 88 y.o. MRN: [id number]  CC:  Chief Complaint  Patient presents with   Follow-up    Patient here today for 1 week follow up     HPI  Gail Mora presents for rt shoulder pain  Past Medical History:  Diagnosis Date   GERD (gastroesophageal reflux disease)    Hyperlipemia    Hypertension    Hypothyroid     Past Surgical History:  Procedure Laterality Date   ABDOMINAL HYSTERECTOMY     BACK SURGERY     CATARACT EXTRACTION     Knee replacemt  2015   Left knee   RETINAL TEAR REPAIR CRYOTHERAPY      Family History  Problem Relation Age of Onset   Bladder Cancer Neg Hx    Kidney cancer Neg Hx     Social History   Socioeconomic History   Marital status: Married    Spouse name: Not on file   Number of children: Not on file   Years of education: Not on file   Highest education level: Not on file  Occupational History   Not on file  Tobacco Use   Smoking status: Former   Smokeless tobacco: Never  Vaping Use   Vaping Use: Never used  Substance and Sexual Activity   Alcohol use: No   Drug use: No   Sexual activity: Not Currently  Other Topics Concern   Not on file  Social History Narrative   Not on file   Social Determinants of Health   Financial Resource Strain: Low Risk    Difficulty of Paying Living Expenses: Not very hard  Food Insecurity: No Food Insecurity   Worried About Charity fundraiser in the Last Year: Never true   Ran Out of Food in the Last Year: Never true  Transportation Needs: No Transportation Needs   Lack of Transportation (Medical): No   Lack of Transportation (Non-Medical): No  Physical Activity: Insufficiently Active   Days of Exercise per Week: 2 days   Minutes of Exercise per Session: 20 min  Stress: Stress Concern Present   Feeling of Stress : To some extent  Social Connections: Unknown   Frequency of Communication  with Friends and Family: More than three times a week   Frequency of Social Gatherings with Friends and Family: More than three times a week   Attends Religious Services: Not on file   Active Member of Clubs or Organizations: Yes   Attends Archivist Meetings: More than 4 times per year   Marital Status: Widowed  Human resources officer Violence: Not At Risk   Fear of Current or Ex-Partner: No   Emotionally Abused: No   Physically Abused: No   Sexually Abused: No     Current Outpatient Medications:    allopurinol (ZYLOPRIM) 100 MG tablet, Take 1 tablet (100 mg total) by mouth daily., Disp: 90 tablet, Rfl: 1   ALPRAZolam (XANAX) 0.25 MG tablet, Take 1 tablet (0.25 mg total) by mouth 2 (two) times daily., Disp: 60 tablet, Rfl: 0   Alum & Mag Hydroxide-Simeth (GI COCKTAIL) SUSP suspension, Take 30 mLs by mouth 2 (two) times daily as needed for indigestion (abd pain). Shake well. Each dose to containe 23m maalox and 152mviscous lidocaine and 22m59monnatal, Disp: 300 mL, Rfl: 0   amLODipine (NORVASC) 5 MG tablet, Take 1 tablet by mouth once daily,  Disp: 90 tablet, Rfl: 0   aspirin EC 81 MG tablet, Take 81 mg by mouth daily. , Disp: , Rfl:    BREO ELLIPTA 200-25 MCG/ACT AEPB, Inhale 1 puff by mouth once daily, Disp: 180 each, Rfl: 0   DULoxetine (CYMBALTA) 20 MG capsule, Take 1 capsule (20 mg total) by mouth daily., Disp: 30 capsule, Rfl: 3   gabapentin (NEURONTIN) 100 MG capsule, Take 1 capsule (100 mg total) by mouth as directed. Take one capsule in am and 1 capsule at lunch and 2 capsules at bedtime., Disp: 360 capsule, Rfl: 3   levothyroxine (SYNTHROID) 100 MCG tablet, TAKE 1 TABLET BY MOUTH ONCE DAILY BEFORE BREAKFAST, Disp: 90 tablet, Rfl: 0   lisinopril-hydrochlorothiazide (ZESTORETIC) 20-25 MG tablet, Take 1 tablet by mouth once daily, Disp: 90 tablet, Rfl: 0   pantoprazole (PROTONIX) 40 MG tablet, Take 1 tablet (40 mg total) by mouth daily., Disp: 30 tablet, Rfl: 3   simvastatin  (ZOCOR) 40 MG tablet, TAKE 1 TABLET BY MOUTH AT BEDTIME, Disp: 90 tablet, Rfl: 0   Allergies  Allergen Reactions   Codeine Nausea Only    ROS Review of Systems  Constitutional: Negative.   HENT: Negative.    Eyes: Negative.   Respiratory: Negative.    Cardiovascular: Negative.   Gastrointestinal: Negative.   Endocrine: Negative.   Genitourinary: Negative.   Musculoskeletal: Negative.        Right shoulder pain  Skin: Negative.   Allergic/Immunologic: Negative.   Neurological: Negative.   Hematological: Negative.   Psychiatric/Behavioral: Negative.    All other systems reviewed and are negative.    Objective:    Physical Exam Musculoskeletal:       Arms:     Comments: Movements of the right shoulder are limited    BP 128/74   Pulse 78   Ht _0  (1.575 m)   Wt 137 lb (62.1 kg)   BMI 25.06 kg/m  Wt Readings from Last 3 Encounters:  04/05/22 137 lb (62.1 kg)  03/30/22 137 lb 12.8 oz (62.5 kg)  03/29/22 136 lb (61.7 kg)     Health Maintenance Due  Topic Date Due   Zoster Vaccines- Shingrix (1 of 2) Never done   Pneumonia Vaccine 23+ Years old (2 - PCV) 10/20/2006   COVID-19 Vaccine (5 - Booster for Pfizer series) 06/16/2021    There are no preventive care reminders to display for this patient.  Lab Results  Component Value Date   TSH 5.20 (H) 09/15/2021   Lab Results  Component Value Date   WBC 7.4 03/29/2022   HGB 12.3 03/29/2022   HCT 37.2 03/29/2022   MCV 95.6 03/29/2022   PLT 256 03/29/2022   Lab Results  Component Value Date   NA 137 03/29/2022   K 4.7 03/29/2022   CO2 27 03/29/2022   GLUCOSE 104 (H) 03/29/2022   BUN 35 (H) 03/29/2022   CREATININE 1.41 (H) 03/29/2022   BILITOT 0.4 09/15/2021   ALKPHOS 44 11/30/2015   AST 13 09/15/2021   ALT 6 09/15/2021   PROT 6.6 09/15/2021   ALBUMIN 4.1 11/30/2015   CALCIUM 10.8 (H) 03/29/2022   ANIONGAP 7 03/29/2022   EGFR 31 (L) 09/15/2021   Lab Results  Component Value Date   CHOL 309 (H)  09/15/2021   Lab Results  Component Value Date   HDL 44 (L) 09/15/2021   Lab Results  Component Value Date   LDLCALC 219 (H) 09/15/2021   Lab Results  Component Value  Date   TRIG 246 (H) 09/15/2021   Lab Results  Component Value Date   CHOLHDL 7.0 (H) 09/15/2021   No results found for: HGBA1C    Assessment & Plan:   Problem List Items Addressed This Visit       Cardiovascular and Mediastinum   Essential hypertension - Primary    Blood pressure was found to be elevated on today's examination         Digestive   Gastroesophageal reflux disease without esophagitis    Patient educated extensively on acid reflux lifestyle modification, including buying a bed wedge, not eating 3 hrs before bedtime, diet modifications, and handout given for the same.          Other   Anxiety disorder    Stable       Right shoulder pain    Right shoulder was injected with lidocaine and cortisone, patient tolerated the procedure well        Joint Injection/Arthrocentesis  Date/Time: 04/05/2022 3:08 PM Performed by: Cletis Athens, MD Authorized by: Cletis Athens, MD  Indications: pain  Body area: shoulder Joint: right acromioclavicular joint Local anesthesia used: yes  Anesthesia: Local anesthesia used: yes Local Anesthetic: lidocaine 1% with epinephrine  Sedation: Patient sedated: no  Needle size: 22 G Approach: posterior Aspirate: clear Betamethasone amount: 40 mg Patient tolerance: patient tolerated the procedure well with no immediate complications Comments: Patient was injected with 1% lidocaine posteriorly and after that right shoulder was injected with 40 mg of Kenalog.  Patient tolerated the procedure well and she was advised to wait in the waiting area for 5 minutes.     No orders of the defined types were placed in this encounter.   Follow-up: No follow-ups on file.    Cletis Athens, MD

## 2022-04-08 ENCOUNTER — Other Ambulatory Visit: Payer: Self-pay | Admitting: Internal Medicine

## 2022-04-13 ENCOUNTER — Other Ambulatory Visit: Payer: Self-pay

## 2022-04-13 DIAGNOSIS — F419 Anxiety disorder, unspecified: Secondary | ICD-10-CM

## 2022-04-13 MED ORDER — ALPRAZOLAM 0.25 MG PO TABS: 0.2500 mg | ORAL_TABLET | Freq: Two times a day (BID) | ORAL | 0 refills | Status: DC

## 2022-04-16 ENCOUNTER — Other Ambulatory Visit: Payer: Self-pay | Admitting: *Deleted

## 2022-05-17 ENCOUNTER — Other Ambulatory Visit: Payer: Self-pay | Admitting: Internal Medicine

## 2022-05-24 ENCOUNTER — Other Ambulatory Visit: Payer: Self-pay | Admitting: *Deleted

## 2022-05-24 DIAGNOSIS — F419 Anxiety disorder, unspecified: Secondary | ICD-10-CM

## 2022-05-24 MED ORDER — ALPRAZOLAM 0.25 MG PO TABS: 0.2500 mg | ORAL_TABLET | Freq: Two times a day (BID) | ORAL | 0 refills | Status: DC

## 2022-07-08 ENCOUNTER — Encounter: Payer: Self-pay | Admitting: Nurse Practitioner

## 2022-07-08 ENCOUNTER — Ambulatory Visit (INDEPENDENT_AMBULATORY_CARE_PROVIDER_SITE_OTHER): Payer: Medicare Other | Admitting: Nurse Practitioner

## 2022-07-08 ENCOUNTER — Other Ambulatory Visit: Payer: Self-pay | Admitting: Internal Medicine

## 2022-07-08 VITALS — BP 147/74 | HR 72 | Ht 62.0 in | Wt 138.0 lb

## 2022-07-08 DIAGNOSIS — K219 Gastro-esophageal reflux disease without esophagitis: Secondary | ICD-10-CM

## 2022-07-08 DIAGNOSIS — R5381 Other malaise: Secondary | ICD-10-CM

## 2022-07-08 DIAGNOSIS — F419 Anxiety disorder, unspecified: Secondary | ICD-10-CM

## 2022-07-08 DIAGNOSIS — I10 Essential (primary) hypertension: Secondary | ICD-10-CM

## 2022-07-08 DIAGNOSIS — N1832 Chronic kidney disease, stage 3b: Secondary | ICD-10-CM | POA: Diagnosis not present

## 2022-07-08 MED ORDER — ALPRAZOLAM 0.25 MG PO TABS: 0.2500 mg | ORAL_TABLET | Freq: Two times a day (BID) | ORAL | 0 refills | Status: DC

## 2022-07-08 NOTE — Assessment & Plan Note (Signed)
Elevated blood pressure readings. Increased lisinopril HCTZ to 1 and half tablet daily. Advised patient to eat Dash diet. We will continue to monitor.

## 2022-07-08 NOTE — Progress Notes (Signed)
Established Patient Office Visit  Subjective:  Patient ID: Gail Mora, female    DOB: 1933-05-14  Age: 88 y.o. MRN: [id number]  CC:  Chief Complaint  Patient presents with   Medication Refill     HPI  Gail Mora presents for medication refill and routine follow up. She is doing fine. No concerns at present.    Medication Refill Pertinent negatives include no chest pain, congestion, fatigue or headaches.     Past Medical History:  Diagnosis Date   GERD (gastroesophageal reflux disease)    Hyperlipemia    Hypertension    Hypothyroid     Past Surgical History:  Procedure Laterality Date   ABDOMINAL HYSTERECTOMY     BACK SURGERY     CATARACT EXTRACTION     Knee replacemt  2015   Left knee   RETINAL TEAR REPAIR CRYOTHERAPY      Family History  Problem Relation Age of Onset   Bladder Cancer Neg Hx    Kidney cancer Neg Hx     Social History   Socioeconomic History   Marital status: Married    Spouse name: Not on file   Number of children: Not on file   Years of education: Not on file   Highest education level: Not on file  Occupational History   Not on file  Tobacco Use   Smoking status: Former   Smokeless tobacco: Never  Vaping Use   Vaping Use: Never used  Substance and Sexual Activity   Alcohol use: No   Drug use: No   Sexual activity: Not Currently  Other Topics Concern   Not on file  Social History Narrative   Not on file   Social Determinants of Health   Financial Resource Strain: Low Risk  (07/17/2021)   Overall Financial Resource Strain (CARDIA)    Difficulty of Paying Living Expenses: Not very hard  Food Insecurity: No Food Insecurity (07/17/2021)   Hunger Vital Sign    Worried About Running Out of Food in the Last Year: Never true    [place] in the Last Year: Never true  Transportation Needs: No Transportation Needs (07/17/2021)   PRAPARE - Hydrologist (Medical): No    Lack of Transportation  (Non-Medical): No  Physical Activity: Insufficiently Active (07/17/2021)   Exercise Vital Sign    Days of Exercise per Week: 2 days    Minutes of Exercise per Session: 20 min  Stress: Stress Concern Present (07/17/2021)   [place]    Feeling of Stress : To some extent  Social Connections: Unknown (07/17/2021)   Social Connection and Isolation Panel [NHANES]    Frequency of Communication with Friends and Family: More than three times a week    Frequency of Social Gatherings with Friends and Family: More than three times a week    Attends Religious Services: Not on file    Active Member of Clubs or Organizations: Yes    Attends Archivist Meetings: More than 4 times per year    Marital Status: Widowed  Intimate Partner Violence: Not At Risk (07/17/2021)   Humiliation, Afraid, Rape, and Kick questionnaire    Fear of Current or Ex-Partner: No    Emotionally Abused: No    Physically Abused: No    Sexually Abused: No     Outpatient Medications Prior to Visit  Medication Sig Dispense Refill   allopurinol (ZYLOPRIM) 100  MG tablet Take 1 tablet (100 mg total) by mouth daily. 90 tablet 1   ALPRAZolam (XANAX) 0.25 MG tablet Take 1 tablet (0.25 mg total) by mouth 2 (two) times daily. 60 tablet 0   amLODipine (NORVASC) 5 MG tablet Take 1 tablet by mouth once daily 90 tablet 0   aspirin EC 81 MG tablet Take 81 mg by mouth daily.      BREO ELLIPTA 200-25 MCG/ACT AEPB Inhale 1 puff by mouth once daily 180 each 0   DULoxetine (CYMBALTA) 20 MG capsule Take 1 capsule (20 mg total) by mouth daily. 30 capsule 3   gabapentin (NEURONTIN) 100 MG capsule Take 1 capsule (100 mg total) by mouth as directed. Take one capsule in am and 1 capsule at lunch and 2 capsules at bedtime. 360 capsule 3   levothyroxine (SYNTHROID) 100 MCG tablet TAKE 1 TABLET BY MOUTH ONCE DAILY BEFORE BREAKFAST 90 tablet 0   lisinopril-hydrochlorothiazide  (ZESTORETIC) 20-25 MG tablet Take 1 tablet by mouth once daily 90 tablet 0   pantoprazole (PROTONIX) 40 MG tablet Take 1 tablet (40 mg total) by mouth daily. 30 tablet 3   simvastatin (ZOCOR) 40 MG tablet TAKE 1 TABLET BY MOUTH AT BEDTIME 90 tablet 0   Alum & Mag Hydroxide-Simeth (GI COCKTAIL) SUSP suspension Take 30 mLs by mouth 2 (two) times daily as needed for indigestion (abd pain). Shake well. Each dose to containe 73m maalox and 130mviscous lidocaine and 63m68monnatal 300 mL 0   No facility-administered medications prior to visit.    Allergies  Allergen Reactions   Codeine Nausea Only    ROS Review of Systems  Constitutional:  Negative for activity change and fatigue.  HENT:  Negative for congestion and postnasal drip.   Respiratory:  Negative for apnea and shortness of breath.   Cardiovascular:  Negative for chest pain and palpitations.  Gastrointestinal: Negative.   Genitourinary: Negative.   Musculoskeletal:  Negative for back pain.  Skin:  Negative for color change.  Neurological:  Negative for dizziness, facial asymmetry and headaches.  Psychiatric/Behavioral:  Negative for agitation, behavioral problems and confusion.       Objective:    Physical Exam Constitutional:      Appearance: Normal appearance. She is obese.  HENT:     Head: Normocephalic.     Right Ear: Tympanic membrane normal.     Left Ear: Tympanic membrane normal.     Nose: Nose normal.     Mouth/Throat:     Mouth: Mucous membranes are moist.     Pharynx: Oropharynx is clear.  Eyes:     Extraocular Movements: Extraocular movements intact.     Conjunctiva/sclera: Conjunctivae normal.     Pupils: Pupils are equal, round, and reactive to light.  Cardiovascular:     Rate and Rhythm: Normal rate and regular rhythm.     Pulses: Normal pulses.     Heart sounds: Normal heart sounds.  Pulmonary:     Effort: Pulmonary effort is normal. No respiratory distress.     Breath sounds: Normal breath  sounds. No rhonchi.  Abdominal:     General: Bowel sounds are normal.     Palpations: Abdomen is soft. There is no mass.     Tenderness: There is no abdominal tenderness.     Hernia: No hernia is present.  Musculoskeletal:        General: Normal range of motion.     Cervical back: Neck supple. No tenderness.  Skin:  General: Skin is warm.     Capillary Refill: Capillary refill takes less than 2 seconds.  Neurological:     General: No focal deficit present.     Mental Status: She is alert and oriented to person, place, and time. Mental status is at baseline.  Psychiatric:        Mood and Affect: Mood normal.        Behavior: Behavior normal.        Thought Content: Thought content normal.        Judgment: Judgment normal.     BP (!) 147/74   Pulse 72   Ht 5' 2"  (1.575 m)   Wt 138 lb (62.6 kg)   BMI 25.24 kg/m  Wt Readings from Last 3 Encounters:  07/08/22 138 lb (62.6 kg)  04/05/22 137 lb (62.1 kg)  03/30/22 137 lb 12.8 oz (62.5 kg)     Health Maintenance  Topic Date Due   Zoster Vaccines- Shingrix (1 of 2) Never done   Pneumonia Vaccine 4+ Years old (2 - PCV) 10/20/2006   COVID-19 Vaccine (5 - Pfizer series) 06/16/2021   INFLUENZA VACCINE  06/15/2022   DEXA SCAN  09/15/2022 (Originally 04/22/2000)   TETANUS/TDAP  10/05/2022 (Originally 04/22/1954)   HPV VACCINES  Aged Out    There are no preventive care reminders to display for this patient.  Lab Results  Component Value Date   TSH 5.20 (H) 09/15/2021   Lab Results  Component Value Date   WBC 7.4 03/29/2022   HGB 12.3 03/29/2022   HCT 37.2 03/29/2022   MCV 95.6 03/29/2022   PLT 256 03/29/2022   Lab Results  Component Value Date   NA 137 03/29/2022   K 4.7 03/29/2022   CO2 27 03/29/2022   GLUCOSE 104 (H) 03/29/2022   BUN 35 (H) 03/29/2022   CREATININE 1.41 (H) 03/29/2022   BILITOT 0.4 09/15/2021   ALKPHOS 44 11/30/2015   AST 13 09/15/2021   ALT 6 09/15/2021   PROT 6.6 09/15/2021   ALBUMIN 4.1  11/30/2015   CALCIUM 10.8 (H) 03/29/2022   ANIONGAP 7 03/29/2022   EGFR 31 (L) 09/15/2021   Lab Results  Component Value Date   CHOL 309 (H) 09/15/2021   Lab Results  Component Value Date   HDL 44 (L) 09/15/2021   Lab Results  Component Value Date   LDLCALC 219 (H) 09/15/2021   Lab Results  Component Value Date   TRIG 246 (H) 09/15/2021   Lab Results  Component Value Date   CHOLHDL 7.0 (H) 09/15/2021   No results found for: "HGBA1C"    Assessment & Plan:   Problem List Items Addressed This Visit       Other   Anxiety disorder     No orders of the defined types were placed in this encounter.    Follow-up: No follow-ups on file.    Theresia Lo, NP

## 2022-07-08 NOTE — Assessment & Plan Note (Signed)
Stable on medication. Refilled Xanax 0.25 mg.

## 2022-07-08 NOTE — Assessment & Plan Note (Addendum)
Her creatinine 1.41, BUN 35 and EGFR 31 on September 25, 2021. Continue statin therapy for cardiovascular risk reduction. We will continue to monitor. 

## 2022-07-08 NOTE — Progress Notes (Signed)
Established Patient Office Visit  Subjective:  Patient ID: Gail Mora, female    DOB: 30-Dec-1932  Age: 88 y.o. MRN: [id number]  CC:  Chief Complaint  Patient presents with   Medication Refill     HPI  Gail Mora presents for medication refill and routine follow-up.  She is overall doing fine no new complaints at present.    Past Medical History:  Diagnosis Date   GERD (gastroesophageal reflux disease)    Hyperlipemia    Hypertension    Hypothyroid     Past Surgical History:  Procedure Laterality Date   ABDOMINAL HYSTERECTOMY     BACK SURGERY     CATARACT EXTRACTION     Knee replacemt  2015   Left knee   RETINAL TEAR REPAIR CRYOTHERAPY      Family History  Problem Relation Age of Onset   Bladder Cancer Neg Hx    Kidney cancer Neg Hx     Social History   Socioeconomic History   Marital status: Married    Spouse name: Not on file   Number of children: Not on file   Years of education: Not on file   Highest education level: Not on file  Occupational History   Not on file  Tobacco Use   Smoking status: Former   Smokeless tobacco: Never  Vaping Use   Vaping Use: Never used  Substance and Sexual Activity   Alcohol use: No   Drug use: No   Sexual activity: Not Currently  Other Topics Concern   Not on file  Social History Narrative   Not on file   Social Determinants of Health   Financial Resource Strain: Low Risk  (07/17/2021)   Overall Financial Resource Strain (CARDIA)    Difficulty of Paying Living Expenses: Not very hard  Food Insecurity: No Food Insecurity (07/17/2021)   Hunger Vital Sign    Worried About Running Out of Food in the Last Year: Never true    [place] in the Last Year: Never true  Transportation Needs: No Transportation Needs (07/17/2021)   PRAPARE - Hydrologist (Medical): No    Lack of Transportation (Non-Medical): No  Physical Activity: Insufficiently Active (07/17/2021)   Exercise Vital  Sign    Days of Exercise per Week: 2 days    Minutes of Exercise per Session: 20 min  Stress: Stress Concern Present (07/17/2021)   [place]    Feeling of Stress : To some extent  Social Connections: Unknown (07/17/2021)   Social Connection and Isolation Panel [NHANES]    Frequency of Communication with Friends and Family: More than three times a week    Frequency of Social Gatherings with Friends and Family: More than three times a week    Attends Religious Services: Not on file    Active Member of Clubs or Organizations: Yes    Attends Archivist Meetings: More than 4 times per year    Marital Status: Widowed  Intimate Partner Violence: Not At Risk (07/17/2021)   Humiliation, Afraid, Rape, and Kick questionnaire    Fear of Current or Ex-Partner: No    Emotionally Abused: No    Physically Abused: No    Sexually Abused: No     Outpatient Medications Prior to Visit  Medication Sig Dispense Refill   allopurinol (ZYLOPRIM) 100 MG tablet Take 1 tablet (100 mg total) by mouth daily. 90 tablet 1  amLODipine (NORVASC) 5 MG tablet Take 1 tablet by mouth once daily 90 tablet 0   aspirin EC 81 MG tablet Take 81 mg by mouth daily.      DULoxetine (CYMBALTA) 20 MG capsule Take 1 capsule (20 mg total) by mouth daily. 30 capsule 3   gabapentin (NEURONTIN) 100 MG capsule Take 1 capsule (100 mg total) by mouth as directed. Take one capsule in am and 1 capsule at lunch and 2 capsules at bedtime. 360 capsule 3   levothyroxine (SYNTHROID) 100 MCG tablet TAKE 1 TABLET BY MOUTH ONCE DAILY BEFORE BREAKFAST 90 tablet 0   simvastatin (ZOCOR) 40 MG tablet TAKE 1 TABLET BY MOUTH AT BEDTIME 90 tablet 0   ALPRAZolam (XANAX) 0.25 MG tablet Take 1 tablet (0.25 mg total) by mouth 2 (two) times daily. 60 tablet 0   BREO ELLIPTA 200-25 MCG/ACT AEPB Inhale 1 puff by mouth once daily 180 each 0   lisinopril-hydrochlorothiazide (ZESTORETIC) 20-25  MG tablet Take 1 tablet by mouth once daily 90 tablet 0   pantoprazole (PROTONIX) 40 MG tablet Take 1 tablet (40 mg total) by mouth daily. 30 tablet 3   Alum & Mag Hydroxide-Simeth (GI COCKTAIL) SUSP suspension Take 30 mLs by mouth 2 (two) times daily as needed for indigestion (abd pain). Shake well. Each dose to containe 67m maalox and 163mviscous lidocaine and 60m110monnatal 300 mL 0   No facility-administered medications prior to visit.    Allergies  Allergen Reactions   Codeine Nausea Only    ROS Review of Systems  Constitutional: Negative.   HENT: Negative.    Eyes: Negative.   Respiratory:  Negative for chest tightness and shortness of breath.   Cardiovascular:  Negative for chest pain and palpitations.  Gastrointestinal: Negative.   Genitourinary: Negative.   Musculoskeletal:  Positive for arthralgias.  Skin: Negative.   Neurological:  Negative for dizziness, facial asymmetry and headaches.  Psychiatric/Behavioral:  Negative for agitation, behavioral problems and confusion.       Objective:    Physical Exam Constitutional:      Appearance: Normal appearance. She is obese.  HENT:     Head: Normocephalic.     Right Ear: Tympanic membrane normal.     Left Ear: Tympanic membrane normal.     Nose: Nose normal.     Mouth/Throat:     Mouth: Mucous membranes are moist.     Pharynx: Oropharynx is clear.  Eyes:     Extraocular Movements: Extraocular movements intact.     Conjunctiva/sclera: Conjunctivae normal.     Pupils: Pupils are equal, round, and reactive to light.  Cardiovascular:     Rate and Rhythm: Normal rate and regular rhythm.     Pulses: Normal pulses.     Heart sounds: Normal heart sounds.  Pulmonary:     Effort: Pulmonary effort is normal. No respiratory distress.     Breath sounds: Normal breath sounds. No rhonchi.  Abdominal:     General: Bowel sounds are normal.     Palpations: Abdomen is soft. There is no mass.     Tenderness: There is no  abdominal tenderness.     Hernia: No hernia is present.  Musculoskeletal:        General: No swelling or deformity.     Cervical back: Neck supple. No tenderness.  Skin:    General: Skin is warm.     Capillary Refill: Capillary refill takes less than 2 seconds.  Neurological:     General:  No focal deficit present.     Mental Status: She is alert and oriented to person, place, and time. Mental status is at baseline.  Psychiatric:        Mood and Affect: Mood normal.        Behavior: Behavior normal.        Thought Content: Thought content normal.        Judgment: Judgment normal.     BP (!) 147/74   Pulse 72   Ht 5' 2"  (1.575 m)   Wt 138 lb (62.6 kg)   BMI 25.24 kg/m  Wt Readings from Last 3 Encounters:  07/08/22 138 lb (62.6 kg)  04/05/22 137 lb (62.1 kg)  03/30/22 137 lb 12.8 oz (62.5 kg)     Health Maintenance  Topic Date Due   Zoster Vaccines- Shingrix (1 of 2) Never done   Pneumonia Vaccine 20+ Years old (2 - PCV) 10/20/2006   COVID-19 Vaccine (5 - Pfizer series) 06/16/2021   INFLUENZA VACCINE  06/15/2022   DEXA SCAN  09/15/2022 (Originally 04/22/2000)   TETANUS/TDAP  10/05/2022 (Originally 04/22/1954)   HPV VACCINES  Aged Out    There are no preventive care reminders to display for this patient.  Lab Results  Component Value Date   TSH 5.20 (H) 09/15/2021   Lab Results  Component Value Date   WBC 7.4 03/29/2022   HGB 12.3 03/29/2022   HCT 37.2 03/29/2022   MCV 95.6 03/29/2022   PLT 256 03/29/2022   Lab Results  Component Value Date   NA 137 03/29/2022   K 4.7 03/29/2022   CO2 27 03/29/2022   GLUCOSE 104 (H) 03/29/2022   BUN 35 (H) 03/29/2022   CREATININE 1.41 (H) 03/29/2022   BILITOT 0.4 09/15/2021   ALKPHOS 44 11/30/2015   AST 13 09/15/2021   ALT 6 09/15/2021   PROT 6.6 09/15/2021   ALBUMIN 4.1 11/30/2015   CALCIUM 10.8 (H) 03/29/2022   ANIONGAP 7 03/29/2022   EGFR 31 (L) 09/15/2021   Lab Results  Component Value Date   CHOL 309 (H)  09/15/2021   Lab Results  Component Value Date   HDL 44 (L) 09/15/2021   Lab Results  Component Value Date   LDLCALC 219 (H) 09/15/2021   Lab Results  Component Value Date   TRIG 246 (H) 09/15/2021   Lab Results  Component Value Date   CHOLHDL 7.0 (H) 09/15/2021   No results found for: "HGBA1C"    Assessment & Plan:   Problem List Items Addressed This Visit       Cardiovascular and Mediastinum   Essential hypertension    Elevated blood pressure readings. Increased lisinopril HCTZ to 1 and half tablet daily. Advised patient to eat Dash diet. We will continue to monitor.        Genitourinary   Stage 3b chronic kidney disease ([place])    Her creatinine 1.41, BUN 35 and EGFR 31 on September 25, 2021. Continue statin therapy for cardiovascular risk reduction. We will continue to monitor.        Other   Anxiety disorder - Primary    Stable on medication. Refilled Xanax 0.25 mg.      Relevant Medications   ALPRAZolam (XANAX) 0.25 MG tablet     Meds ordered this encounter  Medications   DISCONTD: ALPRAZolam (XANAX) 0.25 MG tablet    Sig: Take 1 tablet (0.25 mg total) by mouth 2 (two) times daily.    Dispense:  60 tablet  Refill:  0   ALPRAZolam (XANAX) 0.25 MG tablet    Sig: Take 1 tablet (0.25 mg total) by mouth 2 (two) times daily.    Dispense:  60 tablet    Refill:  0     Follow-up: Return in about 2 years (around 07/08/2024) for BP check.    Theresia Lo, NP

## 2022-07-20 DIAGNOSIS — H35342 Macular cyst, hole, or pseudohole, left eye: Secondary | ICD-10-CM | POA: Diagnosis not present

## 2022-07-26 ENCOUNTER — Ambulatory Visit (INDEPENDENT_AMBULATORY_CARE_PROVIDER_SITE_OTHER): Payer: Medicare Other | Admitting: Internal Medicine

## 2022-07-26 ENCOUNTER — Encounter: Payer: Self-pay | Admitting: Internal Medicine

## 2022-07-26 VITALS — BP 134/67 | HR 67 | Ht 62.0 in | Wt 138.0 lb

## 2022-07-26 DIAGNOSIS — M25511 Pain in right shoulder: Secondary | ICD-10-CM

## 2022-07-26 DIAGNOSIS — K219 Gastro-esophageal reflux disease without esophagitis: Secondary | ICD-10-CM | POA: Diagnosis not present

## 2022-07-26 DIAGNOSIS — I10 Essential (primary) hypertension: Secondary | ICD-10-CM

## 2022-07-26 DIAGNOSIS — M5442 Lumbago with sciatica, left side: Secondary | ICD-10-CM | POA: Diagnosis not present

## 2022-07-26 NOTE — Assessment & Plan Note (Signed)

## 2022-07-26 NOTE — Assessment & Plan Note (Signed)
Patient educated extensively on acid reflux lifestyle modification, including buying a bed wedge, not eating 3 hrs before bedtime, diet modifications, and handout given for the same.  

## 2022-07-26 NOTE — Assessment & Plan Note (Signed)
40 mg of Kenalog was injected in the right shoulder under local anesthesia

## 2022-07-26 NOTE — Assessment & Plan Note (Signed)
-   Patient's back pain is under control with medication.  - Encouraged the patient to stretch or do yoga as able to help with back pain 

## 2022-07-26 NOTE — Progress Notes (Signed)
Established Patient Office Visit  Subjective:  Patient ID: Gail Mora, female    DOB: [birth year]  Age: 88 y.o. MRN: [id number]  CC:  Chief Complaint  Patient presents with   Shoulder Pain    Patient wants steroid shot in right shoulder      Shoulder Pain     Gail Mora presents for painful rt shoulder  Past Medical History:  Diagnosis Date   GERD (gastroesophageal reflux disease)    Hyperlipemia    Hypertension    Hypothyroid     Past Surgical History:  Procedure Laterality Date   ABDOMINAL HYSTERECTOMY     BACK SURGERY     CATARACT EXTRACTION     Knee replacemt  2015   Left knee   RETINAL TEAR REPAIR CRYOTHERAPY      Family History  Problem Relation Age of Onset   Bladder Cancer Neg Hx    Kidney cancer Neg Hx     Social History   Socioeconomic History   Marital status: Married    Spouse name: Not on file   Number of children: Not on file   Years of education: Not on file   Highest education level: Not on file  Occupational History   Not on file  Tobacco Use   Smoking status: Former   Smokeless tobacco: Never  Vaping Use   Vaping Use: Never used  Substance and Sexual Activity   Alcohol use: No   Drug use: No   Sexual activity: Not Currently  Other Topics Concern   Not on file  Social History Narrative   Not on file   Social Determinants of Health   Financial Resource Strain: Low Risk  (07/17/2021)   Overall Financial Resource Strain (CARDIA)    Difficulty of Paying Living Expenses: Not very hard  Food Insecurity: No Food Insecurity (07/17/2021)   Hunger Vital Sign    Worried About Running Out of Food in the Last Year: Never true    Ran Out of Food in the Last Year: Never true  Transportation Needs: No Transportation Needs (07/17/2021)   PRAPARE - Hydrologist (Medical): No    Lack of Transportation (Non-Medical): No  Physical Activity: Insufficiently Active (07/17/2021)   Exercise Vital Sign    Days of  Exercise per Week: 2 days    Minutes of Exercise per Session: 20 min  Stress: Stress Concern Present (07/17/2021)   [place]    Feeling of Stress : To some extent  Social Connections: Unknown (07/17/2021)   Social Connection and Isolation Panel [NHANES]    Frequency of Communication with Friends and Family: More than three times a week    Frequency of Social Gatherings with Friends and Family: More than three times a week    Attends Religious Services: Not on file    Active Member of Clubs or Organizations: Yes    Attends Archivist Meetings: More than 4 times per year    Marital Status: Widowed  Intimate Partner Violence: Not At Risk (07/17/2021)   Humiliation, Afraid, Rape, and Kick questionnaire    Fear of Current or Ex-Partner: No    Emotionally Abused: No    Physically Abused: No    Sexually Abused: No     Current Outpatient Medications:    allopurinol (ZYLOPRIM) 100 MG tablet, Take 1 tablet (100 mg total) by mouth daily., Disp: 90 tablet, Rfl: 1   ALPRAZolam Duanne Moron)  0.25 MG tablet, Take 1 tablet (0.25 mg total) by mouth 2 (two) times daily., Disp: 60 tablet, Rfl: 0   amLODipine (NORVASC) 5 MG tablet, Take 1 tablet by mouth once daily, Disp: 90 tablet, Rfl: 0   aspirin EC 81 MG tablet, Take 81 mg by mouth daily. , Disp: , Rfl:    BREO ELLIPTA 200-25 MCG/ACT AEPB, Inhale 1 puff by mouth once daily, Disp: 180 each, Rfl: 0   DULoxetine (CYMBALTA) 20 MG capsule, Take 1 capsule (20 mg total) by mouth daily., Disp: 30 capsule, Rfl: 3   gabapentin (NEURONTIN) 100 MG capsule, Take 1 capsule (100 mg total) by mouth as directed. Take one capsule in am and 1 capsule at lunch and 2 capsules at bedtime., Disp: 360 capsule, Rfl: 3   levothyroxine (SYNTHROID) 100 MCG tablet, TAKE 1 TABLET BY MOUTH ONCE DAILY BEFORE BREAKFAST, Disp: 90 tablet, Rfl: 0   lisinopril-hydrochlorothiazide (ZESTORETIC) 20-25 MG tablet, Take 1 tablet  by mouth once daily, Disp: 90 tablet, Rfl: 0   pantoprazole (PROTONIX) 40 MG tablet, Take 1 tablet by mouth once daily, Disp: 90 tablet, Rfl: 0   simvastatin (ZOCOR) 40 MG tablet, TAKE 1 TABLET BY MOUTH AT BEDTIME, Disp: 90 tablet, Rfl: 0   Allergies  Allergen Reactions   Codeine Nausea Only    ROS Review of Systems  Constitutional: Negative.   HENT: Negative.    Eyes: Negative.   Respiratory: Negative.    Cardiovascular: Negative.   Gastrointestinal: Negative.   Endocrine: Negative.   Genitourinary: Negative.   Musculoskeletal: Negative.   Skin: Negative.   Allergic/Immunologic: Negative.   Neurological: Negative.   Hematological: Negative.   Psychiatric/Behavioral: Negative.    All other systems reviewed and are negative.     Objective:    Physical Exam Vitals reviewed.  Constitutional:      Appearance: Normal appearance.  HENT:     Mouth/Throat:     Mouth: Mucous membranes are moist.  Eyes:     Pupils: Pupils are equal, round, and reactive to light.  Neck:     Vascular: No carotid bruit.  Cardiovascular:     Rate and Rhythm: Normal rate and regular rhythm.     Pulses: Normal pulses.     Heart sounds: Normal heart sounds.  Pulmonary:     Effort: Pulmonary effort is normal.     Breath sounds: Normal breath sounds.  Abdominal:     General: Bowel sounds are normal.     Palpations: Abdomen is soft. There is no hepatomegaly, splenomegaly or mass.     Tenderness: There is no abdominal tenderness.     Hernia: No hernia is present.  Musculoskeletal:     Right shoulder: Decreased range of motion.       Arms:     Cervical back: Neck supple.     Right lower leg: No edema.     Left lower leg: No edema.  Skin:    Findings: No rash.  Neurological:     Mental Status: She is alert and oriented to person, place, and time.     Motor: No weakness.  Psychiatric:        Mood and Affect: Mood and affect normal.        Behavior: Behavior normal.     BP 134/67    Pulse 67   Ht 5' 2"  (1.575 m)   Wt 138 lb (62.6 kg)   BMI 25.24 kg/m  Wt Readings from Last 3 Encounters:  07/26/22 138  lb (62.6 kg)  07/08/22 138 lb (62.6 kg)  04/05/22 137 lb (62.1 kg)     Health Maintenance Due  Topic Date Due   Zoster Vaccines- Shingrix (1 of 2) Never done   Pneumonia Vaccine 8+ Years old (2 - PCV) 10/20/2006   COVID-19 Vaccine (5 - Pfizer series) 06/16/2021   INFLUENZA VACCINE  06/15/2022    There are no preventive care reminders to display for this patient.  Lab Results  Component Value Date   TSH 5.20 (H) 09/15/2021   Lab Results  Component Value Date   WBC 7.4 03/29/2022   HGB 12.3 03/29/2022   HCT 37.2 03/29/2022   MCV 95.6 03/29/2022   PLT 256 03/29/2022   Lab Results  Component Value Date   NA 137 03/29/2022   K 4.7 03/29/2022   CO2 27 03/29/2022   GLUCOSE 104 (H) 03/29/2022   BUN 35 (H) 03/29/2022   CREATININE 1.41 (H) 03/29/2022   BILITOT 0.4 09/15/2021   ALKPHOS 44 11/30/2015   AST 13 09/15/2021   ALT 6 09/15/2021   PROT 6.6 09/15/2021   ALBUMIN 4.1 11/30/2015   CALCIUM 10.8 (H) 03/29/2022   ANIONGAP 7 03/29/2022   EGFR 31 (L) 09/15/2021   Lab Results  Component Value Date   CHOL 309 (H) 09/15/2021   Lab Results  Component Value Date   HDL 44 (L) 09/15/2021   Lab Results  Component Value Date   LDLCALC 219 (H) 09/15/2021   Lab Results  Component Value Date   TRIG 246 (H) 09/15/2021   Lab Results  Component Value Date   CHOLHDL 7.0 (H) 09/15/2021   No results found for: "HGBA1C"    Assessment & Plan:   Problem List Items Addressed This Visit       Cardiovascular and Mediastinum   Essential hypertension - Primary     Patient denies any chest pain or shortness of breath there is no history of palpitation or paroxysmal nocturnal dyspnea   patient was advised to follow low-salt low-cholesterol diet    ideally I want to keep systolic blood pressure below 130 mmHg, patient was asked to check blood pressure  one times a week and give me a report on that.  Patient will be follow-up in 3 months  or earlier as needed, patient will call me back for any change in the cardiovascular symptoms Patient was advised to buy a book from local bookstore concerning blood pressure and read several chapters  every day.  This will be supplemented by some of the material we will give him from the office.  Patient should also utilize other resources like YouTube and Internet to learn more about the blood pressure and the diet.        Digestive   Gastroesophageal reflux disease without esophagitis    Patient educated extensively on acid reflux lifestyle modification, including buying a bed wedge, not eating 3 hrs before bedtime, diet modifications, and handout given for the same.         Nervous and Auditory   Left-sided low back pain with left-sided sciatica    - Patient's back pain is under control with medication.  - Encouraged the patient to stretch or do yoga as able to help with back pain        Other   Right shoulder pain    40 mg of Kenalog was injected in the right shoulder under local anesthesia     Joint Injection/Arthrocentesis  Date/Time: 07/26/2022 3:24 PM  Performed by: Cletis Athens, MD Authorized by: Cletis Athens, MD  Indications: pain  Body area: shoulder Joint: right shoulder Local anesthesia used: yes Anesthesia: local infiltration  Anesthesia: Local anesthesia used: yes Local Anesthetic: lidocaine 2% without epinephrine  Sedation: Patient sedated: no  Preparation: Patient was prepped and draped in the usual sterile fashion. Needle size: 22 G Ultrasound guidance: no Approach: posterior Triamcinolone amount: 40 mg Lidocaine 2% amount (ml): 1cc. Comments: Right shoulder was injected with 40 mg of Kenalog after infiltration of lidocaine in the subacromial bursa, patient tolerated the procedure well and she was advised to wait in the waiting area for 5 minutes.      No  orders of the defined types were placed in this encounter.   Follow-up: No follow-ups on file.    Cletis Athens, MD

## 2022-08-18 ENCOUNTER — Other Ambulatory Visit: Payer: Self-pay | Admitting: *Deleted

## 2022-08-18 DIAGNOSIS — F419 Anxiety disorder, unspecified: Secondary | ICD-10-CM

## 2022-08-18 MED ORDER — ALPRAZOLAM 0.25 MG PO TABS: 0.2500 mg | ORAL_TABLET | Freq: Two times a day (BID) | ORAL | 0 refills | Status: DC

## 2022-08-23 ENCOUNTER — Other Ambulatory Visit: Payer: Self-pay | Admitting: Internal Medicine

## 2022-08-24 ENCOUNTER — Ambulatory Visit (INDEPENDENT_AMBULATORY_CARE_PROVIDER_SITE_OTHER): Payer: Medicare Other | Admitting: *Deleted

## 2022-08-24 DIAGNOSIS — Z23 Encounter for immunization: Secondary | ICD-10-CM | POA: Diagnosis not present

## 2022-09-30 ENCOUNTER — Encounter: Payer: Self-pay | Admitting: Nurse Practitioner

## 2022-09-30 ENCOUNTER — Ambulatory Visit (INDEPENDENT_AMBULATORY_CARE_PROVIDER_SITE_OTHER): Payer: Medicare Other | Admitting: Nurse Practitioner

## 2022-09-30 DIAGNOSIS — F419 Anxiety disorder, unspecified: Secondary | ICD-10-CM

## 2022-09-30 MED ORDER — ALPRAZOLAM 0.25 MG PO TABS: 0.2500 mg | ORAL_TABLET | Freq: Every morning | ORAL | 0 refills | Status: DC

## 2022-09-30 NOTE — Progress Notes (Signed)
Established Patient Office Visit  Subjective:  Patient ID: Gail Mora, female    DOB: 23-Sep-1933  Age: 88 y.o. MRN: [id number]  CC:  Chief Complaint  Patient presents with   Medication Refill    Refill alprazolam      HPI  Gail Mora presents for refill for medication. She is overall doing well. No new concerns at present.  She has some left ankle edema ankle edema and gets better if she raise her leg.     Past Medical History:  Diagnosis Date   GERD (gastroesophageal reflux disease)    Hyperlipemia    Hypertension    Hypothyroid     Past Surgical History:  Procedure Laterality Date   ABDOMINAL HYSTERECTOMY     BACK SURGERY     CATARACT EXTRACTION     Knee replacemt  2015   Left knee   RETINAL TEAR REPAIR CRYOTHERAPY      Family History  Problem Relation Age of Onset   Bladder Cancer Neg Hx    Kidney cancer Neg Hx     Social History   Socioeconomic History   Marital status: Married    Spouse name: Not on file   Number of children: Not on file   Years of education: Not on file   Highest education level: Not on file  Occupational History   Not on file  Tobacco Use   Smoking status: Former   Smokeless tobacco: Never  Vaping Use   Vaping Use: Never used  Substance and Sexual Activity   Alcohol use: No   Drug use: No   Sexual activity: Not Currently  Other Topics Concern   Not on file  Social History Narrative   Not on file   Social Determinants of Health   Financial Resource Strain: Low Risk  (07/17/2021)   Overall Financial Resource Strain (CARDIA)    Difficulty of Paying Living Expenses: Not very hard  Food Insecurity: No Food Insecurity (07/17/2021)   Hunger Vital Sign    Worried About Running Out of Food in the Last Year: Never true    [place] in the Last Year: Never true  Transportation Needs: No Transportation Needs (07/17/2021)   PRAPARE - Hydrologist (Medical): No    Lack of Transportation  (Non-Medical): No  Physical Activity: Insufficiently Active (07/17/2021)   Exercise Vital Sign    Days of Exercise per Week: 2 days    Minutes of Exercise per Session: 20 min  Stress: Stress Concern Present (07/17/2021)   [place]    Feeling of Stress : To some extent  Social Connections: Unknown (07/17/2021)   Social Connection and Isolation Panel [NHANES]    Frequency of Communication with Friends and Family: More than three times a week    Frequency of Social Gatherings with Friends and Family: More than three times a week    Attends Religious Services: Not on file    Active Member of Clubs or Organizations: Yes    Attends Archivist Meetings: More than 4 times per year    Marital Status: Widowed  Intimate Partner Violence: Not At Risk (07/17/2021)   Humiliation, Afraid, Rape, and Kick questionnaire    Fear of Current or Ex-Partner: No    Emotionally Abused: No    Physically Abused: No    Sexually Abused: No     Outpatient Medications Prior to Visit  Medication Sig  Dispense Refill   gabapentin (NEURONTIN) 100 MG capsule Take 1 capsule (100 mg total) by mouth as directed. Take one capsule in am and 1 capsule at lunch and 2 capsules at bedtime. 360 capsule 3   levothyroxine (SYNTHROID) 100 MCG tablet TAKE 1 TABLET BY MOUTH ONCE DAILY BEFORE BREAKFAST 90 tablet 0   lisinopril-hydrochlorothiazide (ZESTORETIC) 20-25 MG tablet Take 1 tablet by mouth once daily 90 tablet 0   pantoprazole (PROTONIX) 40 MG tablet Take 1 tablet by mouth once daily 90 tablet 0   simvastatin (ZOCOR) 40 MG tablet TAKE 1 TABLET BY MOUTH AT BEDTIME 90 tablet 0   ALPRAZolam (XANAX) 0.25 MG tablet Take 1 tablet (0.25 mg total) by mouth 2 (two) times daily. 60 tablet 0   allopurinol (ZYLOPRIM) 100 MG tablet Take 1 tablet (100 mg total) by mouth daily. 90 tablet 1   amLODipine (NORVASC) 5 MG tablet Take 1 tablet by mouth once daily (Patient not  taking: Reported on 09/30/2022) 90 tablet 0   aspirin EC 81 MG tablet Take 81 mg by mouth daily.  (Patient not taking: Reported on 09/30/2022)     BREO ELLIPTA 200-25 MCG/ACT AEPB Inhale 1 puff by mouth once daily (Patient not taking: Reported on 09/30/2022) 180 each 0   DULoxetine (CYMBALTA) 20 MG capsule Take 1 capsule (20 mg total) by mouth daily. (Patient not taking: Reported on 09/30/2022) 30 capsule 3   No facility-administered medications prior to visit.    Allergies  Allergen Reactions   Codeine Nausea Only    ROS Review of Systems  Constitutional: Negative.   HENT: Negative.    Eyes: Negative.   Respiratory: Negative.    Cardiovascular: Negative.   Gastrointestinal: Negative.   Genitourinary: Negative.   Musculoskeletal: Negative.   Neurological: Negative.   Psychiatric/Behavioral: Negative.        Objective:    Physical Exam Constitutional:      Appearance: Normal appearance. She is normal weight.  HENT:     Head: Normocephalic.     Right Ear: Tympanic membrane normal.     Left Ear: Tympanic membrane normal.     Nose: Nose normal.     Mouth/Throat:     Mouth: Mucous membranes are moist.     Pharynx: Oropharynx is clear.  Eyes:     Extraocular Movements: Extraocular movements intact.     Conjunctiva/sclera: Conjunctivae normal.     Pupils: Pupils are equal, round, and reactive to light.  Cardiovascular:     Rate and Rhythm: Normal rate and regular rhythm.     Pulses: Normal pulses.     Heart sounds: Normal heart sounds.  Pulmonary:     Effort: Pulmonary effort is normal. No respiratory distress.     Breath sounds: Normal breath sounds. No rhonchi.  Abdominal:     General: Bowel sounds are normal.     Palpations: Abdomen is soft. There is no mass.     Tenderness: There is no abdominal tenderness.     Hernia: No hernia is present.  Musculoskeletal:        General: Normal range of motion.     Cervical back: Neck supple. No tenderness.     Left lower  leg: Edema present.  Skin:    General: Skin is warm.     Capillary Refill: Capillary refill takes less than 2 seconds.  Neurological:     General: No focal deficit present.     Mental Status: She is alert and oriented to person, place,  and time. Mental status is at baseline.  Psychiatric:        Mood and Affect: Mood normal.        Behavior: Behavior normal.        Thought Content: Thought content normal.        Judgment: Judgment normal.     BP 138/61   Pulse 68   Temp 97.6 F (36.4 C) (Temporal)   Wt 135 lb 3.2 oz (61.3 kg)   SpO2 99%   BMI 24.73 kg/m  Wt Readings from Last 3 Encounters:  09/30/22 135 lb 3.2 oz (61.3 kg)  07/26/22 138 lb (62.6 kg)  07/08/22 138 lb (62.6 kg)     Health Maintenance  Topic Date Due   Medicare Annual Wellness (AWV)  07/17/2022   TETANUS/TDAP  10/05/2022 (Originally 04/22/1952)   COVID-19 Vaccine (5 - Pfizer series) 10/16/2022 (Originally 06/16/2021)   Zoster Vaccines- Shingrix (1 of 2) 12/31/2022 (Originally 04/22/1985)   Pneumonia Vaccine 39+ Years old (2 - PCV) 10/01/2023 (Originally 10/20/2006)   INFLUENZA VACCINE  Completed   HPV VACCINES  Aged Out   DEXA SCAN  Discontinued    There are no preventive care reminders to display for this patient.  Lab Results  Component Value Date   TSH 5.20 (H) 09/15/2021   Lab Results  Component Value Date   WBC 7.4 03/29/2022   HGB 12.3 03/29/2022   HCT 37.2 03/29/2022   MCV 95.6 03/29/2022   PLT 256 03/29/2022   Lab Results  Component Value Date   NA 137 03/29/2022   K 4.7 03/29/2022   CO2 27 03/29/2022   GLUCOSE 104 (H) 03/29/2022   BUN 35 (H) 03/29/2022   CREATININE 1.41 (H) 03/29/2022   BILITOT 0.4 09/15/2021   ALKPHOS 44 11/30/2015   AST 13 09/15/2021   ALT 6 09/15/2021   PROT 6.6 09/15/2021   ALBUMIN 4.1 11/30/2015   CALCIUM 10.8 (H) 03/29/2022   ANIONGAP 7 03/29/2022   EGFR 31 (L) 09/15/2021   Lab Results  Component Value Date   CHOL 309 (H) 09/15/2021   Lab Results   Component Value Date   HDL 44 (L) 09/15/2021   Lab Results  Component Value Date   LDLCALC 219 (H) 09/15/2021   Lab Results  Component Value Date   TRIG 246 (H) 09/15/2021   Lab Results  Component Value Date   CHOLHDL 7.0 (H) 09/15/2021   No results found for: "HGBA1C"    Assessment & Plan:   Problem List Items Addressed This Visit       Other   Anxiety disorder    Stable with medication. Refilled xanax 0.25 mg       Relevant Medications   ALPRAZolam (XANAX) 0.25 MG tablet     Meds ordered this encounter  Medications   DISCONTD: ALPRAZolam (XANAX) 0.25 MG tablet    Sig: Take 1 tablet (0.25 mg total) by mouth in the morning.    Dispense:  60 tablet    Refill:  0   ALPRAZolam (XANAX) 0.25 MG tablet    Sig: Take 1 tablet (0.25 mg total) by mouth in the morning.    Dispense:  60 tablet    Refill:  0     Follow-up: No follow-ups on file.    Theresia Lo, NP

## 2022-09-30 NOTE — Assessment & Plan Note (Signed)
Stable with medication. Refilled xanax 0.25 mg

## 2022-10-11 ENCOUNTER — Other Ambulatory Visit: Payer: Self-pay | Admitting: Internal Medicine

## 2022-10-26 ENCOUNTER — Other Ambulatory Visit: Payer: Self-pay | Admitting: Internal Medicine

## 2022-10-26 DIAGNOSIS — R5381 Other malaise: Secondary | ICD-10-CM

## 2022-10-27 ENCOUNTER — Other Ambulatory Visit: Payer: Self-pay | Admitting: Internal Medicine

## 2022-10-27 DIAGNOSIS — K219 Gastro-esophageal reflux disease without esophagitis: Secondary | ICD-10-CM

## 2022-11-16 ENCOUNTER — Other Ambulatory Visit: Payer: Self-pay

## 2022-11-16 ENCOUNTER — Telehealth: Payer: Self-pay

## 2022-11-16 DIAGNOSIS — F419 Anxiety disorder, unspecified: Secondary | ICD-10-CM

## 2022-11-16 NOTE — Telephone Encounter (Signed)
MEDICATION:ALPRAZolam (XANAX) 0.25 MG tablet   [place], [place] - [address]   Comments:   **Let patient know to contact pharmacy at the end of the day to make sure medication is ready. **  ** Please notify patient to allow 48-72 hours to process**  **Encourage patient to contact the pharmacy for refills or they can request refills through [facility]**

## 2022-11-16 NOTE — Progress Notes (Signed)
This encounter was created in error - please disregard.

## 2022-11-18 MED ORDER — ALPRAZOLAM 0.25 MG PO TABS: 0.2500 mg | ORAL_TABLET | Freq: Every morning | ORAL | 0 refills | Status: DC

## 2022-11-18 NOTE — Telephone Encounter (Signed)
Patient states she takes twice daily and the pharmacy wont refill until it states "Take 1 tablet (0.25 mg total) by mouth 2 (two) times daily"

## 2022-11-23 MED ORDER — ALPRAZOLAM 0.25 MG PO TABS: 0.2500 mg | ORAL_TABLET | Freq: Two times a day (BID) | ORAL | 0 refills | Status: DC | PRN

## 2022-11-24 ENCOUNTER — Other Ambulatory Visit: Payer: Self-pay | Admitting: Internal Medicine

## 2022-12-30 ENCOUNTER — Other Ambulatory Visit: Payer: Self-pay | Admitting: Nurse Practitioner

## 2022-12-30 DIAGNOSIS — F419 Anxiety disorder, unspecified: Secondary | ICD-10-CM

## 2023-01-03 DIAGNOSIS — R059 Cough, unspecified: Secondary | ICD-10-CM | POA: Diagnosis not present

## 2023-01-03 DIAGNOSIS — R0981 Nasal congestion: Secondary | ICD-10-CM | POA: Diagnosis not present

## 2023-01-10 DIAGNOSIS — J0100 Acute maxillary sinusitis, unspecified: Secondary | ICD-10-CM | POA: Diagnosis not present

## 2023-02-01 ENCOUNTER — Other Ambulatory Visit: Payer: Self-pay | Admitting: Nurse Practitioner

## 2023-02-01 DIAGNOSIS — K219 Gastro-esophageal reflux disease without esophagitis: Secondary | ICD-10-CM

## 2023-03-06 ENCOUNTER — Other Ambulatory Visit: Payer: Self-pay | Admitting: Nurse Practitioner

## 2023-03-06 DIAGNOSIS — K219 Gastro-esophageal reflux disease without esophagitis: Secondary | ICD-10-CM

## 2023-03-07 DIAGNOSIS — E785 Hyperlipidemia, unspecified: Secondary | ICD-10-CM | POA: Diagnosis not present

## 2023-03-07 DIAGNOSIS — I10 Essential (primary) hypertension: Secondary | ICD-10-CM | POA: Diagnosis not present

## 2023-03-16 DIAGNOSIS — K219 Gastro-esophageal reflux disease without esophagitis: Secondary | ICD-10-CM | POA: Diagnosis not present

## 2023-03-16 DIAGNOSIS — M7500 Adhesive capsulitis of unspecified shoulder: Secondary | ICD-10-CM | POA: Diagnosis not present

## 2023-03-16 DIAGNOSIS — M7501 Adhesive capsulitis of right shoulder: Secondary | ICD-10-CM | POA: Diagnosis not present

## 2023-05-12 DIAGNOSIS — H6123 Impacted cerumen, bilateral: Secondary | ICD-10-CM | POA: Diagnosis not present

## 2023-05-12 DIAGNOSIS — H903 Sensorineural hearing loss, bilateral: Secondary | ICD-10-CM | POA: Diagnosis not present

## 2023-05-16 DIAGNOSIS — H905 Unspecified sensorineural hearing loss: Secondary | ICD-10-CM | POA: Diagnosis not present

## 2023-06-01 DIAGNOSIS — K219 Gastro-esophageal reflux disease without esophagitis: Secondary | ICD-10-CM | POA: Diagnosis not present

## 2023-06-01 DIAGNOSIS — J449 Chronic obstructive pulmonary disease, unspecified: Secondary | ICD-10-CM | POA: Diagnosis not present

## 2023-06-01 DIAGNOSIS — M7501 Adhesive capsulitis of right shoulder: Secondary | ICD-10-CM | POA: Diagnosis not present

## 2023-06-01 DIAGNOSIS — E785 Hyperlipidemia, unspecified: Secondary | ICD-10-CM | POA: Diagnosis not present

## 2023-06-01 DIAGNOSIS — I10 Essential (primary) hypertension: Secondary | ICD-10-CM | POA: Diagnosis not present

## 2023-08-22 DIAGNOSIS — E785 Hyperlipidemia, unspecified: Secondary | ICD-10-CM | POA: Diagnosis not present

## 2023-08-22 DIAGNOSIS — M7501 Adhesive capsulitis of right shoulder: Secondary | ICD-10-CM | POA: Diagnosis not present

## 2023-08-22 DIAGNOSIS — J302 Other seasonal allergic rhinitis: Secondary | ICD-10-CM | POA: Diagnosis not present

## 2023-08-22 DIAGNOSIS — J449 Chronic obstructive pulmonary disease, unspecified: Secondary | ICD-10-CM | POA: Diagnosis not present

## 2023-08-22 DIAGNOSIS — K219 Gastro-esophageal reflux disease without esophagitis: Secondary | ICD-10-CM | POA: Diagnosis not present

## 2023-10-12 DIAGNOSIS — H35342 Macular cyst, hole, or pseudohole, left eye: Secondary | ICD-10-CM | POA: Diagnosis not present

## 2024-01-07 IMAGING — CR DG CHEST 2V
2 series · 2 of 2 positions shown · non-contrast
Comparison: 05/04/2018

CLINICAL DATA: Chest pain

EXAM:
CHEST - 2 VIEW

[chest pa]
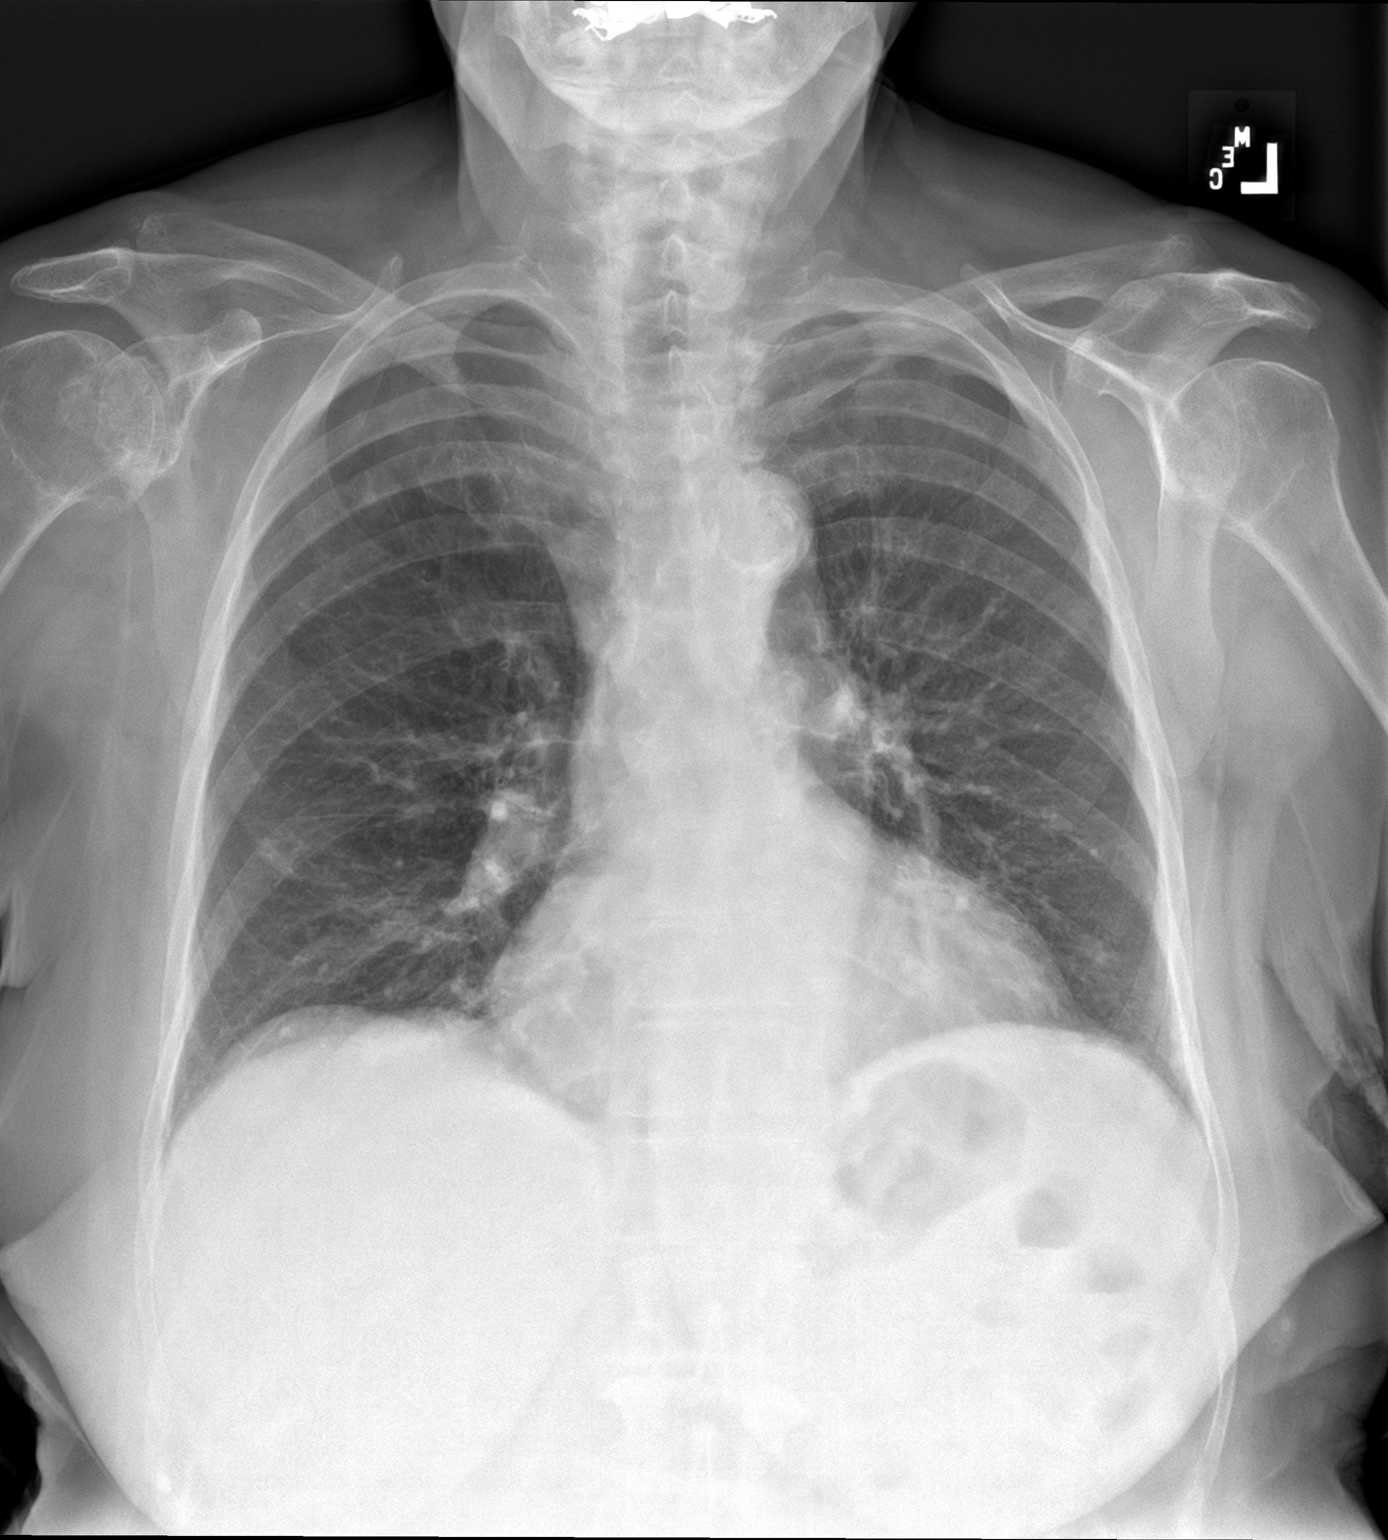

[chest lat]
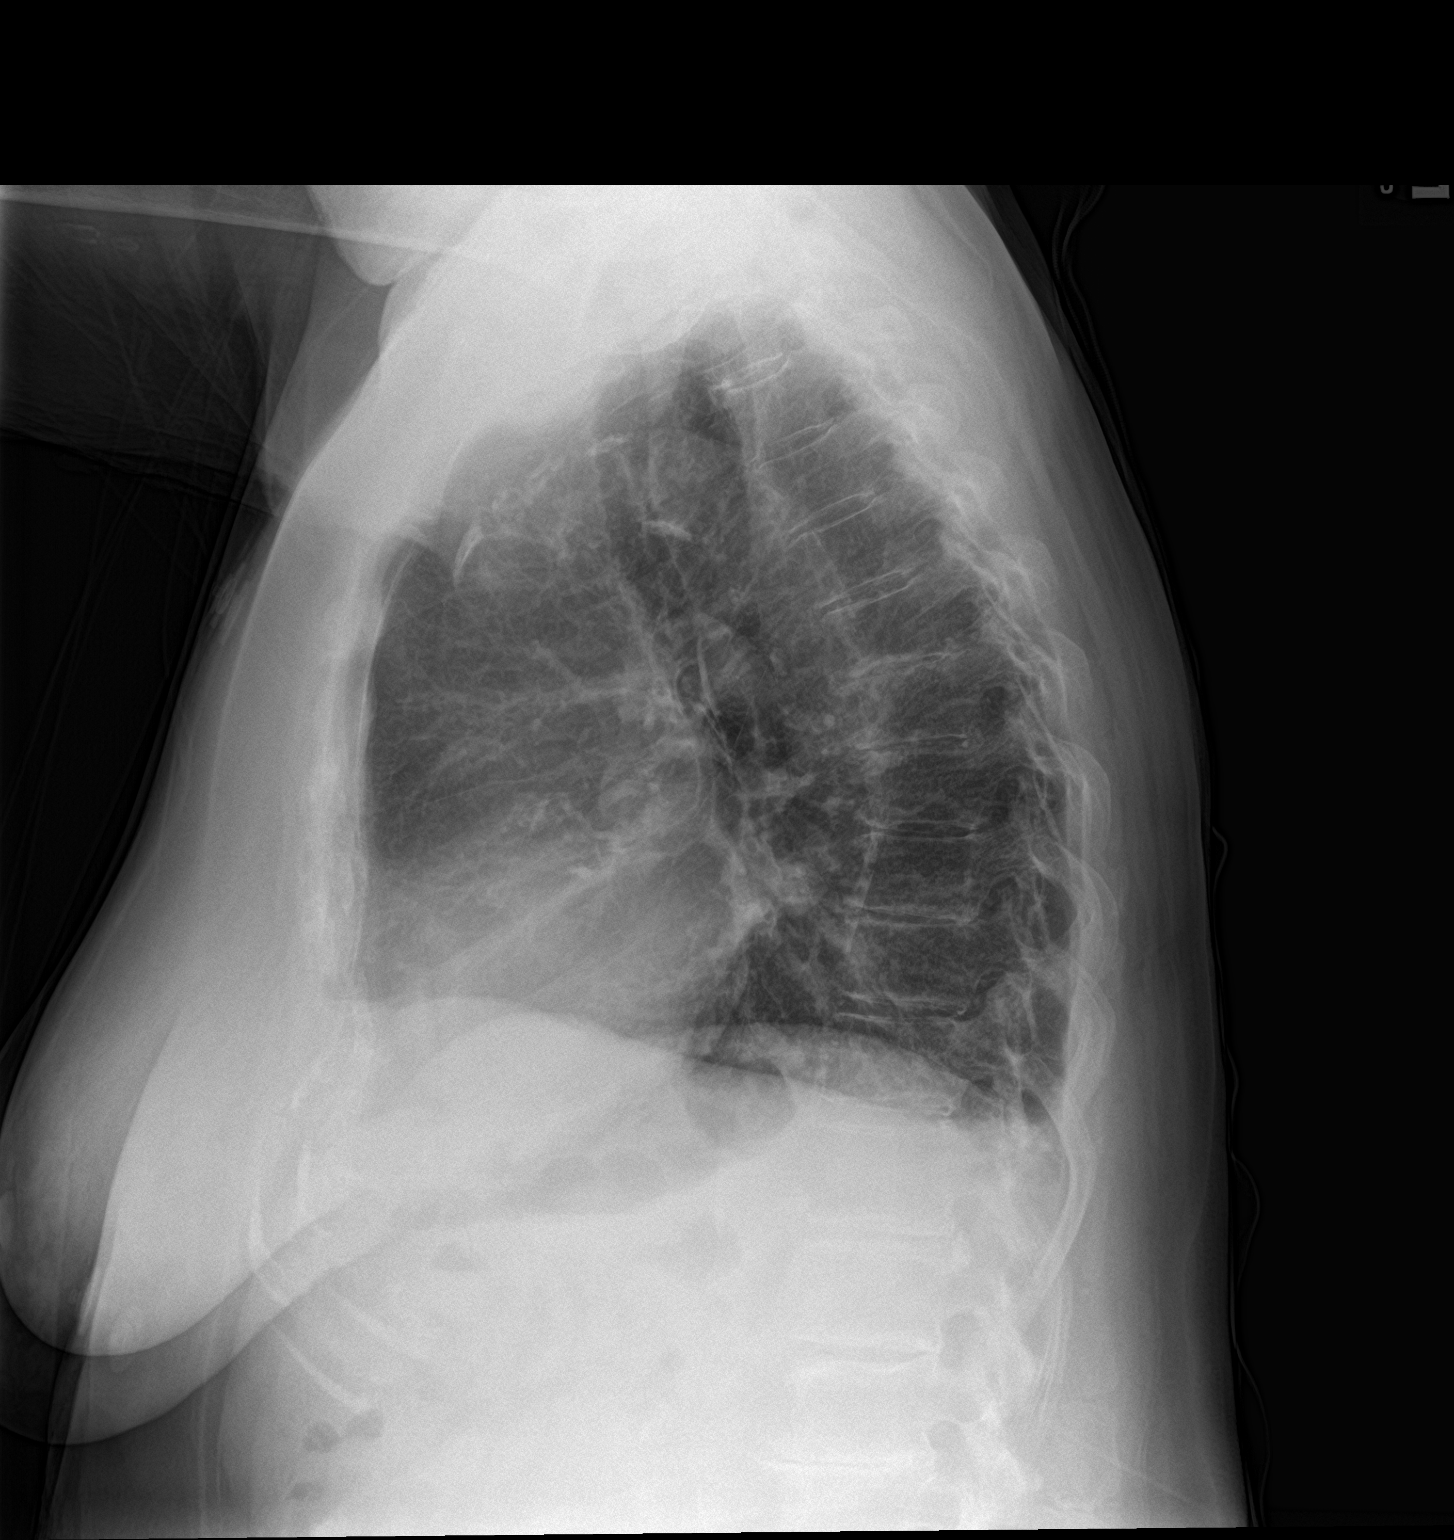

[2 of 2 positions shown; findings below may reference images not displayed]

FINDINGS: Heart is normal size. Aortic atherosclerosis. No confluent opacities
or effusions. No acute bony abnormality.
IMPRESSION: No active cardiopulmonary disease.

## 2024-02-14 DIAGNOSIS — D044 Carcinoma in situ of skin of scalp and neck: Secondary | ICD-10-CM | POA: Diagnosis not present

## 2024-02-14 DIAGNOSIS — L821 Other seborrheic keratosis: Secondary | ICD-10-CM | POA: Diagnosis not present

## 2024-02-14 DIAGNOSIS — D485 Neoplasm of uncertain behavior of skin: Secondary | ICD-10-CM | POA: Diagnosis not present

## 2024-04-12 ENCOUNTER — Encounter: Payer: Self-pay | Admitting: Physician Assistant

## 2024-04-12 ENCOUNTER — Ambulatory Visit: Admitting: Physician Assistant

## 2024-04-12 VITALS — BP 138/60 | HR 87 | Temp 98.9°F | Resp 16 | Ht 62.0 in | Wt 131.4 lb

## 2024-04-12 DIAGNOSIS — R5381 Other malaise: Secondary | ICD-10-CM

## 2024-04-12 DIAGNOSIS — F419 Anxiety disorder, unspecified: Secondary | ICD-10-CM | POA: Diagnosis not present

## 2024-04-12 DIAGNOSIS — E782 Mixed hyperlipidemia: Secondary | ICD-10-CM

## 2024-04-12 DIAGNOSIS — M25511 Pain in right shoulder: Secondary | ICD-10-CM | POA: Diagnosis not present

## 2024-04-12 DIAGNOSIS — E039 Hypothyroidism, unspecified: Secondary | ICD-10-CM | POA: Diagnosis not present

## 2024-04-12 DIAGNOSIS — R5383 Other fatigue: Secondary | ICD-10-CM

## 2024-04-12 DIAGNOSIS — Z7689 Persons encountering health services in other specified circumstances: Secondary | ICD-10-CM

## 2024-04-12 DIAGNOSIS — I10 Essential (primary) hypertension: Secondary | ICD-10-CM

## 2024-04-12 DIAGNOSIS — M5442 Lumbago with sciatica, left side: Secondary | ICD-10-CM | POA: Diagnosis not present

## 2024-04-12 MED ORDER — BREO ELLIPTA 200-25 MCG/ACT IN AEPB: 1.0000 | INHALATION_SPRAY | Freq: Every day | RESPIRATORY_TRACT | 5 refills | Status: AC

## 2024-04-12 MED ORDER — LEVOTHYROXINE SODIUM 100 MCG PO TABS: 100.0000 ug | ORAL_TABLET | Freq: Every day | ORAL | 0 refills | Status: DC

## 2024-04-12 MED ORDER — OMEPRAZOLE 40 MG PO CPDR: 40.0000 mg | DELAYED_RELEASE_CAPSULE | Freq: Every day | ORAL | 3 refills | Status: DC

## 2024-04-12 MED ORDER — GABAPENTIN 100 MG PO CAPS: 100.0000 mg | ORAL_CAPSULE | Freq: Two times a day (BID) | ORAL | 3 refills | Status: AC

## 2024-04-12 MED ORDER — LISINOPRIL-HYDROCHLOROTHIAZIDE 20-25 MG PO TABS: 1.0000 | ORAL_TABLET | Freq: Every day | ORAL | 1 refills | Status: DC

## 2024-04-12 MED ORDER — ALLOPURINOL 100 MG PO TABS
100.0000 mg | ORAL_TABLET | Freq: Every day | ORAL | 1 refills | Status: AC
Start: 2024-04-12 — End: ?

## 2024-04-12 MED ORDER — ALPRAZOLAM 0.25 MG PO TABS: 0.2500 mg | ORAL_TABLET | Freq: Two times a day (BID) | ORAL | 2 refills | Status: DC | PRN

## 2024-04-12 MED ORDER — ROSUVASTATIN CALCIUM 40 MG PO TABS: 40.0000 mg | ORAL_TABLET | Freq: Every day | ORAL | 1 refills | Status: DC

## 2024-04-12 NOTE — Progress Notes (Signed)
 [facility] [address] [place], [place] [address]  Internal MEDICINE  Office Visit Note  Patient Name: Gail Mora  [id number]  [id number]  Date of Service: 04/12/2024   Complaints/HPI Pt is here for establishment of PCP. Chief Complaint  Patient presents with   New Patient (Initial Visit)   Gastroesophageal Reflux   Hypertension   Hyperlipidemia   Medication Refill   Quality Metric Gaps    Needs AWV.   HPI Pt is here to establish care, her daughter is with her today -Was seeing Dr. Loetta Ringer until he retired and needs a new PCP now -Has not had AWV or labs in awhile -takes 1-2 caps gabapentin  per day -taking xanax  daily, 1 sometimes in Am, occasionally 1/2 at lunch or in evening -chronic cough, using breo since then -former smoker, quit 40 years ago -Hx of HTN, HLD, and hypothyroidism as well and takes medication for these  Current Medication: Outpatient Encounter Medications as of 04/12/2024  Medication Sig   aspirin  EC 81 MG tablet Take 81 mg by mouth daily.   cetirizine (ZYRTEC) 10 MG tablet Take 10 mg by mouth daily.   omeprazole  (PRILOSEC) 40 MG capsule Take 1 capsule (40 mg total) by mouth daily.   [DISCONTINUED] allopurinol  (ZYLOPRIM ) 100 MG tablet Take 1 tablet (100 mg total) by mouth daily.   [DISCONTINUED] ALPRAZolam  (XANAX ) 0.25 MG tablet Take 1 tablet (0.25 mg total) by mouth 2 (two) times daily as needed for anxiety.   [DISCONTINUED] amLODipine (NORVASC) 5 MG tablet Take 1 tablet by mouth once daily   [DISCONTINUED] BREO ELLIPTA  200-25 MCG/ACT AEPB Inhale 1 puff by mouth once daily   [DISCONTINUED] DULoxetine  (CYMBALTA ) 20 MG capsule Take 1 capsule (20 mg total) by mouth daily.   [DISCONTINUED] gabapentin  (NEURONTIN ) 100 MG capsule Take 1 capsule (100 mg total) by mouth as directed. Take one capsule in am and 1 capsule at lunch and 2 capsules at bedtime.   [DISCONTINUED] levothyroxine  (SYNTHROID ) 100 MCG tablet TAKE 1 TABLET BY MOUTH ONCE DAILY  BEFORE BREAKFAST   [DISCONTINUED] lisinopril -hydrochlorothiazide  (ZESTORETIC ) 20-25 MG tablet Take 1 tablet by mouth once daily   [DISCONTINUED] pantoprazole  (PROTONIX ) 40 MG tablet Take 1 tablet (40 mg total) by mouth daily. PLEASE CALL OFFICE TO SCHEDULE A TRANSFER OF CARE APPT WITH Deborra Falter, NP   [DISCONTINUED] rosuvastatin  (CRESTOR ) 40 MG tablet Take 40 mg by mouth daily.   [DISCONTINUED] simvastatin  (ZOCOR ) 40 MG tablet TAKE 1 TABLET BY MOUTH AT BEDTIME   allopurinol  (ZYLOPRIM ) 100 MG tablet Take 1 tablet (100 mg total) by mouth daily.   ALPRAZolam  (XANAX ) 0.25 MG tablet Take 1 tablet (0.25 mg total) by mouth 2 (two) times daily as needed for anxiety.   BREO ELLIPTA  200-25 MCG/ACT AEPB Inhale 1 puff into the lungs daily.   gabapentin  (NEURONTIN ) 100 MG capsule Take 1 capsule (100 mg total) by mouth 2 (two) times daily.   levothyroxine  (SYNTHROID ) 100 MCG tablet Take 1 tablet (100 mcg total) by mouth daily before breakfast.   lisinopril -hydrochlorothiazide  (ZESTORETIC ) 20-25 MG tablet Take 1 tablet by mouth daily.   rosuvastatin  (CRESTOR ) 40 MG tablet Take 1 tablet (40 mg total) by mouth daily.   No facility-administered encounter medications on file as of 04/12/2024.    Surgical History: Past Surgical History:  Procedure Laterality Date   ABDOMINAL HYSTERECTOMY     BACK SURGERY     CATARACT EXTRACTION     Knee replacemt  2015   Left knee   RETINAL TEAR  REPAIR CRYOTHERAPY      Medical History: Past Medical History:  Diagnosis Date   GERD (gastroesophageal reflux disease)    Hyperlipemia    Hypertension    Hypothyroid     Family History: Family History  Problem Relation Age of Onset   Hypertension Mother    Hyperlipidemia Mother    Bladder Cancer Neg Hx    Kidney cancer Neg Hx     Social History   Socioeconomic History   Marital status: Married    Spouse name: Not on file   Number of children: Not on file   Years of education: Not on file   Highest education level:  Not on file  Occupational History   Not on file  Tobacco Use   Smoking status: Former   Smokeless tobacco: Never  Vaping Use   Vaping status: Never Used  Substance and Sexual Activity   Alcohol use: No   Drug use: No   Sexual activity: Not Currently  Other Topics Concern   Not on file  Social History Narrative   Not on file   Social Drivers of Health   Financial Resource Strain: Low Risk  (07/17/2021)   Overall Financial Resource Strain (CARDIA)    Difficulty of Paying Living Expenses: Not very hard  Food Insecurity: No Food Insecurity (07/17/2021)   Hunger Vital Sign    Worried About Running Out of Food in the Last Year: Never true    Ran Out of Food in the Last Year: Never true  Transportation Needs: No Transportation Needs (07/17/2021)   PRAPARE - Administrator, Civil Service (Medical): No    Lack of Transportation (Non-Medical): No  Physical Activity: Insufficiently Active (07/17/2021)   Exercise Vital Sign    Days of Exercise per Week: 2 days    Minutes of Exercise per Session: 20 min  Stress: Stress Concern Present (07/17/2021)   [employer] of Occupational Health - Occupational Stress Questionnaire    Feeling of Stress : To some extent  Social Connections: Unknown (07/17/2021)   Social Connection and Isolation Panel    Frequency of Communication with Friends and Family: More than three times a week    Frequency of Social Gatherings with Friends and Family: More than three times a week    Attends Religious Services: Not on file    Active Member of Clubs or Organizations: Yes    Attends Banker Meetings: More than 4 times per year    Marital Status: Widowed  Intimate Partner Violence: Not At Risk (07/17/2021)   Humiliation, Afraid, Rape, and Kick questionnaire    Fear of Current or Ex-Partner: No    Emotionally Abused: No    Physically Abused: No    Sexually Abused: No     Review of Systems  Constitutional:  Negative for chills, fatigue and  unexpected weight change.  HENT:  Positive for postnasal drip. Negative for congestion, rhinorrhea, sneezing and sore throat.   Eyes:  Negative for redness.  Respiratory:  Negative for cough, chest tightness and shortness of breath.   Cardiovascular:  Negative for chest pain and palpitations.  Gastrointestinal:  Negative for abdominal pain, constipation, diarrhea, nausea and vomiting.  Genitourinary:  Negative for dysuria and frequency.  Musculoskeletal:  Positive for arthralgias. Negative for back pain, joint swelling and neck pain.  Skin:  Negative for rash.  Neurological: Negative.  Negative for tremors.  Hematological:  Negative for adenopathy. Does not bruise/bleed easily.  Psychiatric/Behavioral:  Negative for behavioral  problems (Depression), sleep disturbance and suicidal ideas. The patient is nervous/anxious.     Vital Signs: BP 138/60 Comment: 145/60  Pulse 87   Temp 98.9 F (37.2 C)   Resp 16   Ht 5' 2 (1.575 m)   Wt 131 lb 6.4 oz (59.6 kg)   SpO2 99%   BMI 24.03 kg/m    Physical Exam Vitals and nursing note reviewed.  Constitutional:      General: She is not in acute distress.    Appearance: She is well-developed. She is not diaphoretic.  HENT:     Head: Normocephalic and atraumatic.   Eyes:     Extraocular Movements: Extraocular movements intact.   Neck:     Thyroid : No thyromegaly.     Vascular: No JVD.     Trachea: No tracheal deviation.   Cardiovascular:     Rate and Rhythm: Normal rate and regular rhythm.     Heart sounds: Normal heart sounds. No murmur heard.    No friction rub. No gallop.  Pulmonary:     Effort: Pulmonary effort is normal. No respiratory distress.     Breath sounds: No wheezing or rales.  Chest:     Chest wall: No tenderness.  Abdominal:     General: Bowel sounds are normal.   Musculoskeletal:        General: Normal range of motion.   Skin:    General: Skin is warm and dry.   Neurological:     General: No focal deficit  present.     Mental Status: She is alert.   Psychiatric:        Behavior: Behavior normal.        Thought Content: Thought content normal.        Judgment: Judgment normal.       Assessment/Plan: 1. Essential hypertension (Primary) Stable, continue current medication, refill sent - lisinopril -hydrochlorothiazide  (ZESTORETIC ) 20-25 MG tablet; Take 1 tablet by mouth daily.  Dispense: 90 tablet; Refill: 1  2. Mixed hyperlipidemia Will check labs, continue crestor  - rosuvastatin  (CRESTOR ) 40 MG tablet; Take 1 tablet (40 mg total) by mouth daily.  Dispense: 90 tablet; Refill: 1 - Lipid Panel With LDL/HDL Ratio  3. Hypothyroidism, unspecified type Will check labs and adjust synthroid  as indicated - levothyroxine  (SYNTHROID ) 100 MCG tablet; Take 1 tablet (100 mcg total) by mouth daily before breakfast.  Dispense: 90 tablet; Refill: 0 - TSH + free T4  4. Anxiety disorder, unspecified type - ALPRAZolam  (XANAX ) 0.25 MG tablet; Take 1 tablet (0.25 mg total) by mouth 2 (two) times daily as needed for anxiety.  Dispense: 60 tablet; Refill: 2  5. Left-sided low back pain with left-sided sciatica, unspecified chronicity - gabapentin  (NEURONTIN ) 100 MG capsule; Take 1 capsule (100 mg total) by mouth 2 (two) times daily.  Dispense: 60 capsule; Refill: 3  6. Right shoulder pain, unspecified chronicity May follow up with ortho  7. Other fatigue - CBC w/Diff/Platelet - Comprehensive metabolic panel with GFR - TSH + free T4 - Lipid Panel With LDL/HDL Ratio  8. Encounter to establish care with new provider Will order labs and schedule AWV   General Counseling: sharanda shinault understanding of the findings of todays visit and agrees with plan of treatment. I have discussed any further diagnostic evaluation that may be needed or ordered today. We also reviewed her medications today. she has been encouraged to call the office with any questions or concerns that should arise related to todays  visit.  Counseling:    Orders Placed This Encounter  Procedures   CBC w/Diff/Platelet   Comprehensive metabolic panel with GFR   TSH + free T4   Lipid Panel With LDL/HDL Ratio    Meds ordered this encounter  Medications   gabapentin  (NEURONTIN ) 100 MG capsule    Sig: Take 1 capsule (100 mg total) by mouth 2 (two) times daily.    Dispense:  60 capsule    Refill:  3   lisinopril -hydrochlorothiazide  (ZESTORETIC ) 20-25 MG tablet    Sig: Take 1 tablet by mouth daily.    Dispense:  90 tablet    Refill:  1   allopurinol  (ZYLOPRIM ) 100 MG tablet    Sig: Take 1 tablet (100 mg total) by mouth daily.    Dispense:  90 tablet    Refill:  1   omeprazole  (PRILOSEC) 40 MG capsule    Sig: Take 1 capsule (40 mg total) by mouth daily.    Dispense:  30 capsule    Refill:  3   rosuvastatin  (CRESTOR ) 40 MG tablet    Sig: Take 1 tablet (40 mg total) by mouth daily.    Dispense:  90 tablet    Refill:  1   levothyroxine  (SYNTHROID ) 100 MCG tablet    Sig: Take 1 tablet (100 mcg total) by mouth daily before breakfast.    Dispense:  90 tablet    Refill:  0   BREO ELLIPTA  200-25 MCG/ACT AEPB    Sig: Inhale 1 puff into the lungs daily.    Dispense:  28 each    Refill:  5   ALPRAZolam  (XANAX ) 0.25 MG tablet    Sig: Take 1 tablet (0.25 mg total) by mouth 2 (two) times daily as needed for anxiety.    Dispense:  60 tablet    Refill:  2     This patient was seen by Taylor Favia, PA-C in collaboration with Dr. Verneta Gone as a part of collaborative care agreement.   Time spent:35 Minutes

## 2024-04-16 DIAGNOSIS — D044 Carcinoma in situ of skin of scalp and neck: Secondary | ICD-10-CM | POA: Diagnosis not present

## 2024-04-24 DIAGNOSIS — E039 Hypothyroidism, unspecified: Secondary | ICD-10-CM | POA: Diagnosis not present

## 2024-04-24 DIAGNOSIS — E782 Mixed hyperlipidemia: Secondary | ICD-10-CM | POA: Diagnosis not present

## 2024-04-24 DIAGNOSIS — R5383 Other fatigue: Secondary | ICD-10-CM | POA: Diagnosis not present

## 2024-04-25 ENCOUNTER — Telehealth: Payer: Self-pay

## 2024-04-25 ENCOUNTER — Ambulatory Visit: Payer: Self-pay | Admitting: Physician Assistant

## 2024-04-25 DIAGNOSIS — D649 Anemia, unspecified: Secondary | ICD-10-CM

## 2024-04-25 DIAGNOSIS — N184 Chronic kidney disease, stage 4 (severe): Secondary | ICD-10-CM

## 2024-04-25 DIAGNOSIS — E875 Hyperkalemia: Secondary | ICD-10-CM

## 2024-04-25 LAB — CBC WITH DIFFERENTIAL/PLATELET
Basophils Absolute: 0.1 10*3/uL (ref 0.0–0.2)
Basos: 1 %
EOS (ABSOLUTE): 0.6 10*3/uL — ABNORMAL HIGH (ref 0.0–0.4)
Eos: 8 %
Hematocrit: 31.2 % — ABNORMAL LOW (ref 34.0–46.6)
Hemoglobin: 9.8 g/dL — ABNORMAL LOW (ref 11.1–15.9)
Immature Grans (Abs): 0 10*3/uL (ref 0.0–0.1)
Immature Granulocytes: 0 %
Lymphocytes Absolute: 1.8 10*3/uL (ref 0.7–3.1)
Lymphs: 28 %
MCH: 29.5 pg (ref 26.6–33.0)
MCHC: 31.4 g/dL — ABNORMAL LOW (ref 31.5–35.7)
MCV: 94 fL (ref 79–97)
Monocytes Absolute: 0.5 10*3/uL (ref 0.1–0.9)
Monocytes: 8 %
Neutrophils Absolute: 3.6 10*3/uL (ref 1.4–7.0)
Neutrophils: 55 %
Platelets: 267 10*3/uL (ref 150–450)
RBC: 3.32 x10E6/uL — ABNORMAL LOW (ref 3.77–5.28)
RDW: 14.1 % (ref 11.7–15.4)
WBC: 6.5 10*3/uL (ref 3.4–10.8)

## 2024-04-25 LAB — LIPID PANEL WITH LDL/HDL RATIO
Cholesterol, Total: 176 mg/dL (ref 100–199)
HDL: 50 mg/dL (ref >39)
LDL Chol Calc (NIH): 99 mg/dL (ref 0–99)
LDL/HDL Ratio: 2 ratio (ref 0.0–3.2)
Triglycerides: 158 mg/dL — ABNORMAL HIGH (ref 0–149)
VLDL Cholesterol Cal: 27 mg/dL (ref 5–40)

## 2024-04-25 LAB — COMPREHENSIVE METABOLIC PANEL WITH GFR
ALT: 6 IU/L (ref 0–32)
AST: 14 IU/L (ref 0–40)
Albumin: 4 g/dL (ref 3.7–4.7)
Alkaline Phosphatase: 85 IU/L (ref 44–121)
BUN/Creatinine Ratio: 15 (ref 12–28)
BUN: 30 mg/dL — ABNORMAL HIGH (ref 8–27)
Bilirubin Total: 0.3 mg/dL (ref 0.0–1.2)
CO2: 19 mmol/L — ABNORMAL LOW (ref 20–29)
Calcium: 10.4 mg/dL — ABNORMAL HIGH (ref 8.7–10.3)
Chloride: 105 mmol/L (ref 96–106)
Creatinine, Ser: 1.99 mg/dL — ABNORMAL HIGH (ref 0.57–1.00)
Globulin, Total: 2.2 g/dL (ref 1.5–4.5)
Glucose: 90 mg/dL (ref 70–99)
Potassium: 5.3 mmol/L — ABNORMAL HIGH (ref 3.5–5.2)
Sodium: 139 mmol/L (ref 134–144)
Total Protein: 6.2 g/dL (ref 6.0–8.5)
eGFR: 24 mL/min/{1.73_m2} — ABNORMAL LOW (ref >59)

## 2024-04-25 LAB — TSH+FREE T4
Free T4: 1.55 ng/dL (ref 0.82–1.77)
TSH: 0.127 u[IU]/mL — ABNORMAL LOW (ref 0.450–4.500)

## 2024-04-25 NOTE — Telephone Encounter (Signed)
-----   Message from Jacques Mattock sent at 04/25/2024  1:13 PM EDT ----- Please let her know that her thyroid  levels are a little off and we need to decrease her medication. She should take 1/2 tab 1 day per week and full tab remaining days. Also her potassium and calcium  were high and should decrease any supplement. Will need to recheck this. Also her kidney function and hemoglobin were reduced. This appears it may be chronic but since I cannot see her recent records I would like to monitor this. Please confirm no NSAID use and no blood in urine or stools. I have ordered some additional labs to do before next visit (does not have to be fasting)

## 2024-04-25 NOTE — Progress Notes (Signed)
 Lmom to call us back

## 2024-04-25 NOTE — Telephone Encounter (Signed)
 Pt daughter notified for labs result she will discuss with her mother and call us  back

## 2024-04-25 NOTE — Telephone Encounter (Signed)
 Lmom to call us back

## 2024-05-08 ENCOUNTER — Other Ambulatory Visit: Payer: Self-pay

## 2024-05-08 MED ORDER — CETIRIZINE HCL 10 MG PO TABS: 10.0000 mg | ORAL_TABLET | Freq: Every day | ORAL | 3 refills | Status: AC

## 2024-05-16 DIAGNOSIS — D649 Anemia, unspecified: Secondary | ICD-10-CM | POA: Diagnosis not present

## 2024-05-16 DIAGNOSIS — N184 Chronic kidney disease, stage 4 (severe): Secondary | ICD-10-CM | POA: Diagnosis not present

## 2024-05-16 DIAGNOSIS — E875 Hyperkalemia: Secondary | ICD-10-CM | POA: Diagnosis not present

## 2024-05-18 LAB — CBC WITH DIFFERENTIAL/PLATELET
Basophils Absolute: 0.1 x10E3/uL (ref 0.0–0.2)
Basos: 1 %
EOS (ABSOLUTE): 0.5 x10E3/uL — ABNORMAL HIGH (ref 0.0–0.4)
Eos: 9 %
Hematocrit: 30.6 % — ABNORMAL LOW (ref 34.0–46.6)
Hemoglobin: 9.9 g/dL — ABNORMAL LOW (ref 11.1–15.9)
Immature Grans (Abs): 0 x10E3/uL (ref 0.0–0.1)
Immature Granulocytes: 0 %
Lymphocytes Absolute: 2.2 x10E3/uL (ref 0.7–3.1)
Lymphs: 35 %
MCH: 29.7 pg (ref 26.6–33.0)
MCHC: 32.4 g/dL (ref 31.5–35.7)
MCV: 92 fL (ref 79–97)
Monocytes Absolute: 0.4 x10E3/uL (ref 0.1–0.9)
Monocytes: 7 %
Neutrophils Absolute: 2.9 x10E3/uL (ref 1.4–7.0)
Neutrophils: 48 %
Platelets: 262 x10E3/uL (ref 150–450)
RBC: 3.33 x10E6/uL — ABNORMAL LOW (ref 3.77–5.28)
RDW: 14.4 % (ref 11.7–15.4)
WBC: 6.1 x10E3/uL (ref 3.4–10.8)

## 2024-05-18 LAB — IRON,TIBC AND FERRITIN PANEL
Ferritin: 26 ng/mL (ref 15–150)
Iron Saturation: 20 % (ref 15–55)
Iron: 70 ug/dL (ref 27–139)
Total Iron Binding Capacity: 357 ug/dL (ref 250–450)
UIBC: 287 ug/dL (ref 118–369)

## 2024-05-18 LAB — BASIC METABOLIC PANEL WITH GFR
BUN/Creatinine Ratio: 17 (ref 12–28)
BUN: 32 mg/dL — ABNORMAL HIGH (ref 8–27)
CO2: 20 mmol/L (ref 20–29)
Calcium: 10.6 mg/dL — ABNORMAL HIGH (ref 8.7–10.3)
Chloride: 101 mmol/L (ref 96–106)
Creatinine, Ser: 1.89 mg/dL — ABNORMAL HIGH (ref 0.57–1.00)
Glucose: 93 mg/dL (ref 70–99)
Potassium: 4.7 mmol/L (ref 3.5–5.2)
Sodium: 134 mmol/L (ref 134–144)
eGFR: 25 mL/min/1.73 — ABNORMAL LOW (ref >59)

## 2024-05-18 LAB — B12 AND FOLATE PANEL
Folate: 11.8 ng/mL (ref >3.0)
Vitamin B-12: 179 pg/mL — ABNORMAL LOW (ref 232–1245)

## 2024-05-18 LAB — PTH, INTACT AND CALCIUM: PTH: 123 pg/mL — ABNORMAL HIGH (ref 15–65)

## 2024-05-21 ENCOUNTER — Encounter: Payer: Self-pay | Admitting: Physician Assistant

## 2024-05-21 ENCOUNTER — Ambulatory Visit (INDEPENDENT_AMBULATORY_CARE_PROVIDER_SITE_OTHER): Admitting: Physician Assistant

## 2024-05-21 VITALS — BP 157/71 | HR 62 | Temp 97.8°F | Resp 16 | Ht 62.0 in | Wt 127.0 lb

## 2024-05-21 DIAGNOSIS — E538 Deficiency of other specified B group vitamins: Secondary | ICD-10-CM

## 2024-05-21 DIAGNOSIS — D649 Anemia, unspecified: Secondary | ICD-10-CM

## 2024-05-21 DIAGNOSIS — N184 Chronic kidney disease, stage 4 (severe): Secondary | ICD-10-CM | POA: Diagnosis not present

## 2024-05-21 DIAGNOSIS — I10 Essential (primary) hypertension: Secondary | ICD-10-CM | POA: Diagnosis not present

## 2024-05-21 DIAGNOSIS — E210 Primary hyperparathyroidism: Secondary | ICD-10-CM | POA: Diagnosis not present

## 2024-05-21 DIAGNOSIS — Z0000 Encounter for general adult medical examination without abnormal findings: Secondary | ICD-10-CM

## 2024-05-21 DIAGNOSIS — E039 Hypothyroidism, unspecified: Secondary | ICD-10-CM | POA: Diagnosis not present

## 2024-05-21 MED ORDER — CYANOCOBALAMIN 1000 MCG/ML IJ SOLN
1000.0000 ug | Freq: Once | INTRAMUSCULAR | Status: AC
  Administered 2024-05-21: 1000 ug via INTRAMUSCULAR

## 2024-05-21 MED ORDER — OMEPRAZOLE 40 MG PO CPDR: 40.0000 mg | DELAYED_RELEASE_CAPSULE | Freq: Every day | ORAL | 3 refills | Status: AC

## 2024-05-21 NOTE — Progress Notes (Signed)
 [facility] [address] [place], [place] [address]  Internal MEDICINE  Office Visit Note  Patient Name: Gail Mora  [id number]  [id number]  Date of Service: 05/22/2024  Chief Complaint  Patient presents with   Medicare Wellness   Gastroesophageal Reflux   Hypertension   Hyperlipidemia   Medication Refill    Omeprazole  40mg     HPI Gail Mora presents for an annual well visit, her daughter is with her today Well-appearing 89 y.o.female  Labs: previously called with results showing TSH suppression and was advised on taking 1/2 tab synthroid  1 day per week, however pt has been doing 1/2 tab synthroid  everyday. Additionally hemoglobin low and this was stable on repeat and likely chronic. Renal function also remained abnormal and appears CKD. Potassium improved on recheck, but calcium  remained high despite no supplement and PTH also elevated which would point toward a primary hyperparathyroidism. B12 low and pt does have neuropathy -may need to consider adjustment of medications such as hydrochlorothiazide , however she has been on this for years -she does report hx of RAI for hyperthyroidism many decades ago. Will go ahead with endo referral due to elevated PTH and calcium   while also establishing with nephrology for CKD with anemia Other concerns: will think about PNA vaccine -has not been checking BP at home and reports BP always higher in office. Will start monitoring and advised to  bring log and cuff next visit     05/21/2024    2:59 PM 07/17/2021   10:13 AM  MMSE - Mini Mental State Exam  Orientation to time 5 5  Orientation to Place 5 5  Registration 3 3  Attention/ Calculation 5 5  Recall 3 3  Language- name 2 objects 2 2  Language- repeat 1 1  Language- follow 3 step command 3 3  Language- read & follow direction 1 1  Write a sentence 1 1  Copy design 1 1  Total score 30 30    Functional Status Survey: Is the patient deaf or have difficulty hearing?:  Yes Does the patient have difficulty seeing, even when wearing glasses/contacts?: No Does the patient have difficulty concentrating, remembering, or making decisions?: No Does the patient have difficulty walking or climbing stairs?: No Does the patient have difficulty dressing or bathing?: No Does the patient have difficulty doing errands alone such as visiting a doctor's office or shopping?: No     01/18/2022   11:36 AM 03/29/2022    5:27 PM 07/08/2022    2:10 PM 04/12/2024    2:14 PM 05/21/2024    2:58 PM  Fall Risk  Falls in the past year? 1  0 0 0  Was there an injury with Fall? 1  0    Fall Risk Category Calculator 3  0    Fall Risk Category (Retired) High   Low     (RETIRED) Patient Fall Risk Level High fall risk  Low fall risk  Low fall risk     Patient at Risk for Falls Due to History of fall(s)  No Fall Risks    Fall risk Follow up Falls evaluation completed   Falls evaluation completed        Data saved with a previous flowsheet row definition       05/21/2024    2:59 PM  Depression screen PHQ 2/9  Decreased Interest 0  Down, Depressed, Hopeless 0  PHQ - 2 Score 0        No data to display  Current Medication: Outpatient Encounter Medications as of 05/21/2024  Medication Sig   allopurinol  (ZYLOPRIM ) 100 MG tablet Take 1 tablet (100 mg total) by mouth daily.   ALPRAZolam  (XANAX ) 0.25 MG tablet Take 1 tablet (0.25 mg total) by mouth 2 (two) times daily as needed for anxiety.   aspirin  EC 81 MG tablet Take 81 mg by mouth daily.   BREO ELLIPTA  200-25 MCG/ACT AEPB Inhale 1 puff into the lungs daily.   cetirizine  (ZYRTEC ) 10 MG tablet Take 1 tablet (10 mg total) by mouth daily.   gabapentin  (NEURONTIN ) 100 MG capsule Take 1 capsule (100 mg total) by mouth 2 (two) times daily.   levothyroxine  (SYNTHROID ) 100 MCG tablet Take 1 tablet (100 mcg total) by mouth daily before breakfast.   lisinopril -hydrochlorothiazide  (ZESTORETIC ) 20-25 MG tablet Take 1 tablet by  mouth daily.   rosuvastatin  (CRESTOR ) 40 MG tablet Take 1 tablet (40 mg total) by mouth daily.   [DISCONTINUED] omeprazole  (PRILOSEC) 40 MG capsule Take 1 capsule (40 mg total) by mouth daily.   omeprazole  (PRILOSEC) 40 MG capsule Take 1 capsule (40 mg total) by mouth daily.   [EXPIRED] cyanocobalamin  (VITAMIN B12) injection 1,000 mcg    No facility-administered encounter medications on file as of 05/21/2024.    Surgical History: Past Surgical History:  Procedure Laterality Date   ABDOMINAL HYSTERECTOMY     BACK SURGERY     CATARACT EXTRACTION     Knee replacemt  2015   Left knee   RETINAL TEAR REPAIR CRYOTHERAPY      Medical History: Past Medical History:  Diagnosis Date   GERD (gastroesophageal reflux disease)    Hyperlipemia    Hypertension    Hypothyroid     Family History: Family History  Problem Relation Age of Onset   Hypertension Mother    Hyperlipidemia Mother    Bladder Cancer Neg Hx    Kidney cancer Neg Hx     Social History   Socioeconomic History   Marital status: Married    Spouse name: Not on file   Number of children: Not on file   Years of education: Not on file   Highest education level: Not on file  Occupational History   Not on file  Tobacco Use   Smoking status: Former   Smokeless tobacco: Never  Vaping Use   Vaping status: Never Used  Substance and Sexual Activity   Alcohol use: No   Drug use: No   Sexual activity: Not Currently  Other Topics Concern   Not on file  Social History Narrative   Not on file   Social Drivers of Health   Financial Resource Strain: Low Risk  (07/17/2021)   Overall Financial Resource Strain (CARDIA)    Difficulty of Paying Living Expenses: Not very hard  Food Insecurity: No Food Insecurity (07/17/2021)   Hunger Vital Sign    Worried About Running Out of Food in the Last Year: Never true    Ran Out of Food in the Last Year: Never true  Transportation Needs: No Transportation Needs (07/17/2021)   PRAPARE -  Administrator, Civil Service (Medical): No    Lack of Transportation (Non-Medical): No  Physical Activity: Insufficiently Active (07/17/2021)   Exercise Vital Sign    Days of Exercise per Week: 2 days    Minutes of Exercise per Session: 20 min  Stress: Stress Concern Present (07/17/2021)   [employer] of Occupational Health - Occupational Stress Questionnaire    Feeling of Stress :  To some extent  Social Connections: Unknown (07/17/2021)   Social Connection and Isolation Panel    Frequency of Communication with Friends and Family: More than three times a week    Frequency of Social Gatherings with Friends and Family: More than three times a week    Attends Religious Services: Not on file    Active Member of Clubs or Organizations: Yes    Attends Banker Meetings: More than 4 times per year    Marital Status: Widowed  Intimate Partner Violence: Not At Risk (07/17/2021)   Humiliation, Afraid, Rape, and Kick questionnaire    Fear of Current or Ex-Partner: No    Emotionally Abused: No    Physically Abused: No    Sexually Abused: No      Review of Systems  Constitutional:  Negative for chills, fatigue and unexpected weight change.  HENT:  Negative for congestion, rhinorrhea, sneezing and sore throat.   Eyes:  Negative for redness.  Respiratory:  Negative for cough, chest tightness and shortness of breath.   Cardiovascular:  Negative for chest pain and palpitations.  Gastrointestinal:  Negative for abdominal pain, constipation, diarrhea, nausea and vomiting.  Genitourinary:  Negative for dysuria and frequency.  Musculoskeletal:  Positive for arthralgias. Negative for back pain, joint swelling and neck pain.  Skin:  Negative for rash.  Neurological: Negative.  Negative for tremors.  Hematological:  Negative for adenopathy. Does not bruise/bleed easily.  Psychiatric/Behavioral:  Negative for behavioral problems (Depression), sleep disturbance and suicidal ideas.  The patient is nervous/anxious.     Vital Signs: BP (!) 157/71   Pulse 62   Temp 97.8 F (36.6 C)   Resp 16   Ht 5' 2 (1.575 m)   Wt 127 lb (57.6 kg)   SpO2 96%   BMI 23.23 kg/m    Physical Exam Vitals and nursing note reviewed.  Constitutional:      General: She is not in acute distress.    Appearance: She is well-developed. She is not diaphoretic.  HENT:     Head: Normocephalic and atraumatic.  Eyes:     Extraocular Movements: Extraocular movements intact.  Neck:     Thyroid : No thyromegaly.     Vascular: No JVD.     Trachea: No tracheal deviation.  Cardiovascular:     Rate and Rhythm: Normal rate and regular rhythm.     Heart sounds: Normal heart sounds. No murmur heard.    No friction rub. No gallop.  Pulmonary:     Effort: Pulmonary effort is normal. No respiratory distress.     Breath sounds: No wheezing or rales.  Chest:     Chest wall: No tenderness.  Skin:    General: Skin is warm and dry.  Neurological:     General: No focal deficit present.     Mental Status: She is alert.  Psychiatric:        Behavior: Behavior normal.        Thought Content: Thought content normal.        Judgment: Judgment normal.        Assessment/Plan: 1. Encounter for annual wellness exam in Medicare patient (Primary) AWV performed, labs reviewed, pt will consider PNA vaccine  2. Essential hypertension Elevated in office, pt will start monitoring at home and bring log and cuff next visit. Reports hx of white coat hypertension  3. CKD (chronic kidney disease) stage 4, GFR 15-29 ml/min (HCC) - Ambulatory referral to Nephrology  4. Primary hyperparathyroidism (HCC) -  Ambulatory referral to Nephrology - Ambulatory referral to Endocrinology  5. Hypercalcemia - Ambulatory referral to Nephrology - Ambulatory referral to Endocrinology  6. Chronic anemia Appears stable and likely due to CKD - Ambulatory referral to Nephrology  7. Hypothyroidism, unspecified type TSH  suppressed and had been advised to take 1/2 tab synthroid  once per week and full remaining days but pt has been taking 1/2 tab daily. Will continue this for now and recheck labs either with endo if established or at next office visit - Ambulatory referral to Endocrinology  8. B12 deficiency - cyanocobalamin  (VITAMIN B12) injection 1,000 mcg     General Counseling: mikeisha lemonds understanding of the findings of todays visit and agrees with plan of treatment. I have discussed any further diagnostic evaluation that may be needed or ordered today. We also reviewed her medications today. she has been encouraged to call the office with any questions or concerns that should arise related to todays visit.    Orders Placed This Encounter  Procedures   Ambulatory referral to Nephrology   Ambulatory referral to Endocrinology    Meds ordered this encounter  Medications   omeprazole  (PRILOSEC) 40 MG capsule    Sig: Take 1 capsule (40 mg total) by mouth daily.    Dispense:  30 capsule    Refill:  3   cyanocobalamin  (VITAMIN B12) injection 1,000 mcg    Return in about 4 weeks (around 06/18/2024) for HTN, b12 shots.   Total time spent:35 Minutes Time spent includes review of chart, medications, test results, and follow up plan with the patient.   [place] Controlled Substance Database was reviewed by me.  This patient was seen by Tinnie Pro, PA-C in collaboration with Dr. Sigrid Bathe as a part of collaborative care agreement.  Tinnie Pro, PA-C Internal medicine

## 2024-05-30 ENCOUNTER — Telehealth: Payer: Self-pay

## 2024-05-30 ENCOUNTER — Ambulatory Visit (INDEPENDENT_AMBULATORY_CARE_PROVIDER_SITE_OTHER)

## 2024-05-30 ENCOUNTER — Other Ambulatory Visit: Payer: Self-pay

## 2024-05-30 DIAGNOSIS — E538 Deficiency of other specified B group vitamins: Secondary | ICD-10-CM

## 2024-05-30 MED ORDER — CYANOCOBALAMIN 1000 MCG/ML IJ SOLN
1000.0000 ug | Freq: Once | INTRAMUSCULAR | Status: AC
  Administered 2024-05-30: 1000 ug via INTRAMUSCULAR

## 2024-05-30 MED ORDER — AMLODIPINE BESYLATE 5 MG PO TABS: 5.0000 mg | ORAL_TABLET | Freq: Every day | ORAL | 2 refills | Status: DC

## 2024-05-30 NOTE — Telephone Encounter (Signed)
 Spoke with patient's daughter regarding some med changes per DFK/Lauren. Patient is to STOP synthroid  and Lisinopril /Hydrochlorothiazide  for now. Sent Amlodipine  5mg  instead. Patient comes in for a B12 injection today, will tell patient face to face.

## 2024-06-06 ENCOUNTER — Ambulatory Visit (INDEPENDENT_AMBULATORY_CARE_PROVIDER_SITE_OTHER)

## 2024-06-06 DIAGNOSIS — E538 Deficiency of other specified B group vitamins: Secondary | ICD-10-CM | POA: Diagnosis not present

## 2024-06-06 MED ORDER — CYANOCOBALAMIN 1000 MCG/ML IJ SOLN
1000.0000 ug | Freq: Once | INTRAMUSCULAR | Status: AC
  Administered 2024-06-06: 1000 ug via INTRAMUSCULAR

## 2024-06-28 ENCOUNTER — Encounter: Payer: Self-pay | Admitting: Physician Assistant

## 2024-06-28 ENCOUNTER — Ambulatory Visit (INDEPENDENT_AMBULATORY_CARE_PROVIDER_SITE_OTHER): Admitting: Physician Assistant

## 2024-06-28 VITALS — BP 154/76 | HR 87 | Temp 98.0°F | Resp 16 | Ht 62.0 in | Wt 138.4 lb

## 2024-06-28 DIAGNOSIS — N184 Chronic kidney disease, stage 4 (severe): Secondary | ICD-10-CM | POA: Diagnosis not present

## 2024-06-28 DIAGNOSIS — E538 Deficiency of other specified B group vitamins: Secondary | ICD-10-CM

## 2024-06-28 DIAGNOSIS — E210 Primary hyperparathyroidism: Secondary | ICD-10-CM | POA: Diagnosis not present

## 2024-06-28 DIAGNOSIS — R5383 Other fatigue: Secondary | ICD-10-CM

## 2024-06-28 DIAGNOSIS — E039 Hypothyroidism, unspecified: Secondary | ICD-10-CM

## 2024-06-28 DIAGNOSIS — I10 Essential (primary) hypertension: Secondary | ICD-10-CM | POA: Diagnosis not present

## 2024-06-28 MED ORDER — LISINOPRIL-HYDROCHLOROTHIAZIDE 20-25 MG PO TABS
1.0000 | ORAL_TABLET | Freq: Every day | ORAL | 1 refills | Status: DC
Start: 2024-06-28 — End: 2024-07-26

## 2024-06-28 MED ORDER — CYANOCOBALAMIN 1000 MCG/ML IJ SOLN
1000.0000 ug | Freq: Once | INTRAMUSCULAR | Status: AC
  Administered 2024-06-28: 1000 ug via INTRAMUSCULAR

## 2024-06-28 NOTE — Progress Notes (Signed)
 [facility] [address] [place], [place] [address]  Internal MEDICINE  Office Visit Note  Patient Name: Gail Mora  [id number]  [id number]  Date of Service: 07/23/2024  Chief Complaint  Patient presents with   Follow-up   Hypertension   Gastroesophageal Reflux   Hypothyroidism   Medication Management    Bilateral leg swelling, water retention. Normally uses a diuretic, but hasn't been on one. Also not peeing as frequently. All symptoms started after switching BP meds.    HPI Pt is here for routine follow up, her daughter is with her -B12 shots are helping -Bp not checked at home, but ankle swelling and retention since then -would like to go back to previous BP med as she thinks this worked better and she did not have ankle swelling -has been holding synthroid  and is due for repeat labs.   Current Medication: Outpatient Encounter Medications as of 06/28/2024  Medication Sig   allopurinol  (ZYLOPRIM ) 100 MG tablet Take 1 tablet (100 mg total) by mouth daily.   ALPRAZolam  (XANAX ) 0.25 MG tablet Take 1 tablet (0.25 mg total) by mouth 2 (two) times daily as needed for anxiety.   amLODipine  (NORVASC ) 5 MG tablet Take 1 tablet (5 mg total) by mouth daily.   aspirin  EC 81 MG tablet Take 81 mg by mouth daily.   BREO ELLIPTA  200-25 MCG/ACT AEPB Inhale 1 puff into the lungs daily.   cetirizine  (ZYRTEC ) 10 MG tablet Take 1 tablet (10 mg total) by mouth daily.   gabapentin  (NEURONTIN ) 100 MG capsule Take 1 capsule (100 mg total) by mouth 2 (two) times daily.   levothyroxine  (SYNTHROID ) 100 MCG tablet Take 1 tablet (100 mcg total) by mouth daily before breakfast.   omeprazole  (PRILOSEC) 40 MG capsule Take 1 capsule (40 mg total) by mouth daily.   rosuvastatin  (CRESTOR ) 40 MG tablet Take 1 tablet (40 mg total) by mouth daily.   [DISCONTINUED] lisinopril -hydrochlorothiazide  (ZESTORETIC ) 20-25 MG tablet Take 1 tablet by mouth daily.   lisinopril -hydrochlorothiazide  (ZESTORETIC )  20-25 MG tablet Take 1 tablet by mouth daily.   [EXPIRED] cyanocobalamin  (VITAMIN B12) injection 1,000 mcg    No facility-administered encounter medications on file as of 06/28/2024.    Surgical History: Past Surgical History:  Procedure Laterality Date   ABDOMINAL HYSTERECTOMY     BACK SURGERY     CATARACT EXTRACTION     Knee replacemt  2015   Left knee   RETINAL TEAR REPAIR CRYOTHERAPY      Medical History: Past Medical History:  Diagnosis Date   GERD (gastroesophageal reflux disease)    Hyperlipemia    Hypertension    Hypothyroid     Family History: Family History  Problem Relation Age of Onset   Hypertension Mother    Hyperlipidemia Mother    Bladder Cancer Neg Hx    Kidney cancer Neg Hx     Social History   Socioeconomic History   Marital status: Married    Spouse name: Not on file   Number of children: Not on file   Years of education: Not on file   Highest education level: Not on file  Occupational History   Not on file  Tobacco Use   Smoking status: Former   Smokeless tobacco: Never  Vaping Use   Vaping status: Never Used  Substance and Sexual Activity   Alcohol use: No   Drug use: No   Sexual activity: Not Currently  Other Topics Concern   Not on file  Social History Narrative   Not on file   Social Drivers of Health   Financial Resource Strain: Low Risk  (07/17/2021)   Overall Financial Resource Strain (CARDIA)    Difficulty of Paying Living Expenses: Not very hard  Food Insecurity: No Food Insecurity (07/17/2021)   Hunger Vital Sign    Worried About Running Out of Food in the Last Year: Never true    Ran Out of Food in the Last Year: Never true  Transportation Needs: No Transportation Needs (07/17/2021)   PRAPARE - Administrator, Civil Service (Medical): No    Lack of Transportation (Non-Medical): No  Physical Activity: Insufficiently Active (07/17/2021)   Exercise Vital Sign    Days of Exercise per Week: 2 days    Minutes of  Exercise per Session: 20 min  Stress: Stress Concern Present (07/17/2021)   [employer] of Occupational Health - Occupational Stress Questionnaire    Feeling of Stress : To some extent  Social Connections: Unknown (07/17/2021)   Social Connection and Isolation Panel    Frequency of Communication with Friends and Family: More than three times a week    Frequency of Social Gatherings with Friends and Family: More than three times a week    Attends Religious Services: Not on file    Active Member of Clubs or Organizations: Yes    Attends Banker Meetings: More than 4 times per year    Marital Status: Widowed  Intimate Partner Violence: Not At Risk (07/17/2021)   Humiliation, Afraid, Rape, and Kick questionnaire    Fear of Current or Ex-Partner: No    Emotionally Abused: No    Physically Abused: No    Sexually Abused: No      Review of Systems  Constitutional:  Negative for chills, fatigue and unexpected weight change.  HENT:  Negative for congestion, rhinorrhea, sneezing and sore throat.   Eyes:  Negative for redness.  Respiratory:  Negative for cough, chest tightness and shortness of breath.   Cardiovascular:  Positive for leg swelling. Negative for chest pain and palpitations.  Gastrointestinal:  Negative for abdominal pain, constipation, diarrhea, nausea and vomiting.  Genitourinary:  Negative for dysuria and frequency.  Musculoskeletal:  Positive for arthralgias. Negative for back pain, joint swelling and neck pain.  Skin:  Negative for rash.  Neurological: Negative.  Negative for tremors.  Hematological:  Negative for adenopathy. Does not bruise/bleed easily.  Psychiatric/Behavioral:  Negative for behavioral problems (Depression), sleep disturbance and suicidal ideas. The patient is nervous/anxious.     Vital Signs: BP (!) 154/76   Pulse 87   Temp 98 F (36.7 C)   Resp 16   Ht 5' 2 (1.575 m)   Wt 138 lb 6.4 oz (62.8 kg)   SpO2 96%   BMI 25.31 kg/m     Physical Exam Vitals and nursing note reviewed.  Constitutional:      General: She is not in acute distress.    Appearance: She is well-developed. She is not diaphoretic.  HENT:     Head: Normocephalic and atraumatic.  Eyes:     Extraocular Movements: Extraocular movements intact.  Neck:     Thyroid : No thyromegaly.     Vascular: No JVD.     Trachea: No tracheal deviation.  Cardiovascular:     Rate and Rhythm: Normal rate and regular rhythm.     Heart sounds: Normal heart sounds. No murmur heard.    No friction rub. No gallop.  Pulmonary:  Effort: Pulmonary effort is normal. No respiratory distress.     Breath sounds: No wheezing or rales.  Chest:     Chest wall: No tenderness.  Musculoskeletal:     Right lower leg: Edema present.     Left lower leg: Edema present.  Skin:    General: Skin is warm and dry.  Neurological:     General: No focal deficit present.     Mental Status: She is alert.  Psychiatric:        Behavior: Behavior normal.        Thought Content: Thought content normal.        Judgment: Judgment normal.        Assessment/Plan: 1. Essential hypertension (Primary) Will start back on lisinopril -hydrochlorothiazide  which should help swelling again, but will need to monitor renal function - lisinopril -hydrochlorothiazide  (ZESTORETIC ) 20-25 MG tablet; Take 1 tablet by mouth daily.  Dispense: 90 tablet; Refill: 1  2. CKD (chronic kidney disease) stage 4, GFR 15-29 ml/min (HCC) Will update labs - Comprehensive metabolic panel with GFR  3. Hypercalcemia Will check additional labs - Kappa/lambda light chains - PTH, Intact and Calcium  - Protein Electrophoresis, (serum) - Protein Electrophoresis, Urine Rflx.  4. Hypothyroidism, unspecified type Will update labs and adjust synthroid  accordingly--has been holding - TSH + free T4  5. B12 deficiency - cyanocobalamin  (VITAMIN B12) injection 1,000 mcg  6. Primary hyperparathyroidism (HCC) Will  check additional labs - Kappa/lambda light chains - Protein Electrophoresis, (serum) - Protein Electrophoresis, Urine Rflx.  7. Other fatigue - Kappa/lambda light chains - Comprehensive metabolic panel with GFR - Protein Electrophoresis, (serum) - Protein Electrophoresis, Urine Rflx.   General Counseling: riot barrick understanding of the findings of todays visit and agrees with plan of treatment. I have discussed any further diagnostic evaluation that may be needed or ordered today. We also reviewed her medications today. she has been encouraged to call the office with any questions or concerns that should arise related to todays visit.    Orders Placed This Encounter  Procedures   Kappa/lambda light chains   TSH + free T4   PTH, Intact and Calcium    Comprehensive metabolic panel with GFR   Protein Electrophoresis, (serum)   Protein Electrophoresis, Urine Rflx.    Meds ordered this encounter  Medications   cyanocobalamin  (VITAMIN B12) injection 1,000 mcg   lisinopril -hydrochlorothiazide  (ZESTORETIC ) 20-25 MG tablet    Sig: Take 1 tablet by mouth daily.    Dispense:  90 tablet    Refill:  1    This patient was seen by Tinnie Pro, PA-C in collaboration with Dr. Sigrid Bathe as a part of collaborative care agreement.   Total time spent:30 Minutes Time spent includes review of chart, medications, test results, and follow up plan with the patient.      Dr Fozia M Khan Internal medicine

## 2024-07-04 ENCOUNTER — Ambulatory Visit

## 2024-07-18 DIAGNOSIS — E210 Primary hyperparathyroidism: Secondary | ICD-10-CM | POA: Diagnosis not present

## 2024-07-18 DIAGNOSIS — R5383 Other fatigue: Secondary | ICD-10-CM | POA: Diagnosis not present

## 2024-07-18 DIAGNOSIS — E039 Hypothyroidism, unspecified: Secondary | ICD-10-CM | POA: Diagnosis not present

## 2024-07-18 DIAGNOSIS — N184 Chronic kidney disease, stage 4 (severe): Secondary | ICD-10-CM | POA: Diagnosis not present

## 2024-07-19 LAB — PROTEIN ELECTROPHORESIS, URINE REFLEX

## 2024-07-20 LAB — COMPREHENSIVE METABOLIC PANEL WITH GFR
ALT: 25 IU/L (ref 0–32)
AST: 55 IU/L — ABNORMAL HIGH (ref 0–40)
Albumin: 4.2 g/dL (ref 3.7–4.7)
Alkaline Phosphatase: 84 IU/L (ref 44–121)
BUN/Creatinine Ratio: 13 (ref 12–28)
BUN: 31 mg/dL — ABNORMAL HIGH (ref 8–27)
Bilirubin Total: 0.3 mg/dL (ref 0.0–1.2)
CO2: 21 mmol/L (ref 20–29)
Calcium: 11.1 mg/dL — ABNORMAL HIGH (ref 8.7–10.3)
Chloride: 96 mmol/L (ref 96–106)
Creatinine, Ser: 2.43 mg/dL — ABNORMAL HIGH (ref 0.57–1.00)
Globulin, Total: 2.8 g/dL (ref 1.5–4.5)
Glucose: 91 mg/dL (ref 70–99)
Potassium: 5.4 mmol/L — ABNORMAL HIGH (ref 3.5–5.2)
Sodium: 132 mmol/L — ABNORMAL LOW (ref 134–144)
Total Protein: 7 g/dL (ref 6.0–8.5)
eGFR: 19 mL/min/1.73 — ABNORMAL LOW (ref >59)

## 2024-07-20 LAB — PROTEIN ELECTROPHORESIS, URINE REFLEX
Albumin ELP, Urine: 49.1
Alpha-1-Globulin, U: 5.2
Alpha-2-Globulin, U: 11
Beta Globulin, U: 23.4
Gamma Globulin, U: 11.2
Protein, Ur: 15.6 mg/dL

## 2024-07-20 LAB — KAPPA/LAMBDA LIGHT CHAINS
Ig Kappa Free Light Chain: 75.7 mg/L — ABNORMAL HIGH (ref 3.3–19.4)
Ig Lambda Free Light Chain: 41.1 mg/L — ABNORMAL HIGH (ref 5.7–26.3)
KAPPA/LAMBDA RATIO: 1.84 — ABNORMAL HIGH (ref 0.26–1.65)

## 2024-07-20 LAB — PROTEIN ELECTROPHORESIS, SERUM
A/G Ratio: 1.1 (ref 0.7–1.7)
Albumin ELP: 3.7 g/dL (ref 2.9–4.4)
Alpha 1: 0.3 g/dL (ref 0.0–0.4)
Alpha 2: 1 g/dL (ref 0.4–1.0)
Beta: 1.3 g/dL (ref 0.7–1.3)
Gamma Globulin: 0.7 g/dL (ref 0.4–1.8)
Globulin, Total: 3.3 g/dL (ref 2.2–3.9)

## 2024-07-20 LAB — TSH+FREE T4
Free T4: 0.11 ng/dL — ABNORMAL LOW (ref 0.82–1.77)
TSH: 116 u[IU]/mL — ABNORMAL HIGH (ref 0.450–4.500)

## 2024-07-20 LAB — PTH, INTACT AND CALCIUM: PTH: 122 pg/mL — AB (ref 15–65)

## 2024-07-23 ENCOUNTER — Ambulatory Visit: Payer: Self-pay | Admitting: Physician Assistant

## 2024-07-23 DIAGNOSIS — E039 Hypothyroidism, unspecified: Secondary | ICD-10-CM

## 2024-07-23 MED ORDER — LEVOTHYROXINE SODIUM 50 MCG PO TABS: 50.0000 ug | ORAL_TABLET | Freq: Every day | ORAL | 3 refills | Status: AC

## 2024-07-26 ENCOUNTER — Encounter: Payer: Self-pay | Admitting: Physician Assistant

## 2024-07-26 ENCOUNTER — Ambulatory Visit (INDEPENDENT_AMBULATORY_CARE_PROVIDER_SITE_OTHER): Admitting: Physician Assistant

## 2024-07-26 VITALS — BP 130/80 | HR 75 | Temp 98.1°F | Resp 16 | Ht 62.0 in | Wt 139.0 lb

## 2024-07-26 DIAGNOSIS — R768 Other specified abnormal immunological findings in serum: Secondary | ICD-10-CM | POA: Diagnosis not present

## 2024-07-26 DIAGNOSIS — N184 Chronic kidney disease, stage 4 (severe): Secondary | ICD-10-CM | POA: Diagnosis not present

## 2024-07-26 DIAGNOSIS — I10 Essential (primary) hypertension: Secondary | ICD-10-CM | POA: Diagnosis not present

## 2024-07-26 DIAGNOSIS — E538 Deficiency of other specified B group vitamins: Secondary | ICD-10-CM | POA: Diagnosis not present

## 2024-07-26 DIAGNOSIS — E039 Hypothyroidism, unspecified: Secondary | ICD-10-CM

## 2024-07-26 MED ORDER — FUROSEMIDE 20 MG PO TABS: ORAL_TABLET | ORAL | 3 refills | Status: DC

## 2024-07-26 MED ORDER — LISINOPRIL 20 MG PO TABS
20.0000 mg | ORAL_TABLET | Freq: Every day | ORAL | 3 refills | Status: AC
Start: 2024-07-26 — End: ?

## 2024-07-26 MED ORDER — CYANOCOBALAMIN 1000 MCG/ML IJ SOLN
1000.0000 ug | Freq: Once | INTRAMUSCULAR | Status: AC
  Administered 2024-07-26: 1000 ug via INTRAMUSCULAR

## 2024-07-26 NOTE — Progress Notes (Signed)
 [facility] [address] [place], [place] [address]  Internal MEDICINE  Office Visit Note  Patient Name: Gail Mora  [id number]  [id number]  Date of Service: 07/26/2024  Chief Complaint  Patient presents with   Follow-up    Lab review   Gastroesophageal Reflux   Hypertension   Hyperlipidemia    HPI Pt is here for routine follow up, her daughter is with her -BP 130s at home now -has just been taking hyzaar and has been better. Swelling is improved. She likes being back on her original medication as she has been on lisinopril -hydrochlorothiazide  for years -will be starting on lower dose synthroid  again after holding completely led to TSH spike -reviewed labs: unfortunately renal function further declined and potassium and calcium  high. Discussed trial of lisinopril  on its own with 1/2 tab lasix  prn rather than hctz to help bring calcium  down while still getting some diuresis. Explained diuretics can worsen kidney function and will continue to monitor. -Discussed level of renal function decline and elevated PTH and need for nephrology referral, however pt declines. She states she would not want to do dialysis if it came to it and therefore does not want to see anyone else -Additionally kappa/lambda light lights elevated, but no M spike. Discussed the possible causes of this including her poor renal function, but also possible cancer concerns and need for hematology/oncology evaluation. Pt declines this referral as well. States she understands the possibility of a cancer diagnosis and prefers to live how she is without additional doctor visits or medications. Daughter also present and supported patient decision, but they may call if any changes -does feel better with B12 injections  Current Medication: Outpatient Encounter Medications as of 07/26/2024  Medication Sig   allopurinol  (ZYLOPRIM ) 100 MG tablet Take 1 tablet (100 mg total) by mouth daily.   ALPRAZolam  (XANAX )  0.25 MG tablet Take 1 tablet (0.25 mg total) by mouth 2 (two) times daily as needed for anxiety.   amLODipine  (NORVASC ) 5 MG tablet Take 1 tablet (5 mg total) by mouth daily.   aspirin  EC 81 MG tablet Take 81 mg by mouth daily.   BREO ELLIPTA  200-25 MCG/ACT AEPB Inhale 1 puff into the lungs daily.   cetirizine  (ZYRTEC ) 10 MG tablet Take 1 tablet (10 mg total) by mouth daily.   furosemide  (LASIX ) 20 MG tablet Take 1/2 tab by mouth daily for leg swelling. May increase to full tablet if needed   gabapentin  (NEURONTIN ) 100 MG capsule Take 1 capsule (100 mg total) by mouth 2 (two) times daily.   levothyroxine  (SYNTHROID ) 50 MCG tablet Take 1 tablet (50 mcg total) by mouth daily.   lisinopril  (ZESTRIL ) 20 MG tablet Take 1 tablet (20 mg total) by mouth daily.   omeprazole  (PRILOSEC) 40 MG capsule Take 1 capsule (40 mg total) by mouth daily.   rosuvastatin  (CRESTOR ) 40 MG tablet Take 1 tablet (40 mg total) by mouth daily.   [DISCONTINUED] lisinopril -hydrochlorothiazide  (ZESTORETIC ) 20-25 MG tablet Take 1 tablet by mouth daily.   [EXPIRED] cyanocobalamin  (VITAMIN B12) injection 1,000 mcg    No facility-administered encounter medications on file as of 07/26/2024.    Surgical History: Past Surgical History:  Procedure Laterality Date   ABDOMINAL HYSTERECTOMY     BACK SURGERY     CATARACT EXTRACTION     Knee replacemt  2015   Left knee   RETINAL TEAR REPAIR CRYOTHERAPY      Medical History: Past Medical History:  Diagnosis Date  GERD (gastroesophageal reflux disease)    Hyperlipemia    Hypertension    Hypothyroid     Family History: Family History  Problem Relation Age of Onset   Hypertension Mother    Hyperlipidemia Mother    Bladder Cancer Neg Hx    Kidney cancer Neg Hx     Social History   Socioeconomic History   Marital status: Married    Spouse name: Not on file   Number of children: Not on file   Years of education: Not on file   Highest education level: Not on file   Occupational History   Not on file  Tobacco Use   Smoking status: Former   Smokeless tobacco: Never  Vaping Use   Vaping status: Never Used  Substance and Sexual Activity   Alcohol use: No   Drug use: No   Sexual activity: Not Currently  Other Topics Concern   Not on file  Social History Narrative   Not on file   Social Drivers of Health   Financial Resource Strain: Low Risk  (07/17/2021)   Overall Financial Resource Strain (CARDIA)    Difficulty of Paying Living Expenses: Not very hard  Food Insecurity: No Food Insecurity (07/17/2021)   Hunger Vital Sign    Worried About Running Out of Food in the Last Year: Never true    Ran Out of Food in the Last Year: Never true  Transportation Needs: No Transportation Needs (07/17/2021)   PRAPARE - Administrator, Civil Service (Medical): No    Lack of Transportation (Non-Medical): No  Physical Activity: Insufficiently Active (07/17/2021)   Exercise Vital Sign    Days of Exercise per Week: 2 days    Minutes of Exercise per Session: 20 min  Stress: Stress Concern Present (07/17/2021)   [employer] of Occupational Health - Occupational Stress Questionnaire    Feeling of Stress : To some extent  Social Connections: Unknown (07/17/2021)   Social Connection and Isolation Panel    Frequency of Communication with Friends and Family: More than three times a week    Frequency of Social Gatherings with Friends and Family: More than three times a week    Attends Religious Services: Not on file    Active Member of Clubs or Organizations: Yes    Attends Banker Meetings: More than 4 times per year    Marital Status: Widowed  Intimate Partner Violence: Not At Risk (07/17/2021)   Humiliation, Afraid, Rape, and Kick questionnaire    Fear of Current or Ex-Partner: No    Emotionally Abused: No    Physically Abused: No    Sexually Abused: No      Review of Systems  Constitutional:  Negative for chills, fatigue and  unexpected weight change.  HENT:  Negative for congestion, rhinorrhea, sneezing and sore throat.   Eyes:  Negative for redness.  Respiratory:  Negative for cough, chest tightness and shortness of breath.   Cardiovascular:  Negative for chest pain, palpitations and leg swelling.  Gastrointestinal:  Negative for abdominal pain, constipation, diarrhea, nausea and vomiting.  Genitourinary:  Negative for dysuria and frequency.  Musculoskeletal:  Positive for arthralgias. Negative for back pain, joint swelling and neck pain.  Skin:  Negative for rash.  Neurological: Negative.  Negative for tremors.  Hematological:  Negative for adenopathy. Does not bruise/bleed easily.  Psychiatric/Behavioral:  Negative for behavioral problems (Depression), sleep disturbance and suicidal ideas. The patient is nervous/anxious.     Vital Signs: BP  130/80   Pulse 75   Temp 98.1 F (36.7 C)   Resp 16   Ht 5' 2 (1.575 m)   Wt 139 lb (63 kg)   SpO2 96%   BMI 25.42 kg/m    Physical Exam Vitals and nursing note reviewed.  Constitutional:      General: She is not in acute distress.    Appearance: She is well-developed. She is not diaphoretic.  HENT:     Head: Normocephalic and atraumatic.  Eyes:     Extraocular Movements: Extraocular movements intact.  Neck:     Thyroid : No thyromegaly.     Vascular: No JVD.     Trachea: No tracheal deviation.  Cardiovascular:     Rate and Rhythm: Normal rate and regular rhythm.     Heart sounds: Normal heart sounds. No murmur heard.    No friction rub. No gallop.  Pulmonary:     Effort: Pulmonary effort is normal. No respiratory distress.     Breath sounds: No wheezing or rales.  Chest:     Chest wall: No tenderness.  Musculoskeletal:     Right lower leg: No edema.     Left lower leg: No edema.  Skin:    General: Skin is warm and dry.  Neurological:     General: No focal deficit present.     Mental Status: She is alert.  Psychiatric:        Behavior:  Behavior normal.        Thought Content: Thought content normal.        Judgment: Judgment normal.        Assessment/Plan: 1. Essential hypertension (Primary) Will hold hyzaar and switch to lisinopril  on its own with 1/2 tab lasix  prn for swelling. May need to switch to something like hydralazine for BP and reduce renal impact if not improving still - lisinopril  (ZESTRIL ) 20 MG tablet; Take 1 tablet (20 mg total) by mouth daily.  Dispense: 30 tablet; Refill: 3 - furosemide  (LASIX ) 20 MG tablet; Take 1/2 tab by mouth daily for leg swelling. May increase to full tablet if needed  Dispense: 30 tablet; Refill: 3  2. CKD (chronic kidney disease) stage 4, GFR 15-29 ml/min (HCC) Will continue to monitor, pt declines nephrology referral and understands declining renal function - Basic Metabolic Panel (BMET)  3. Elevated serum immunoglobulin free light chain level Explained possible causes including potential cancer, but pt declines further work up or hematology referral at this time  4. Hypercalcemia Will stop hydrochlorothiazide  and switch to 1/2 lasix  prn and monitor. Discussed byproduct of renal disease - Basic Metabolic Panel (BMET)  5. Hypothyroidism, unspecified type Will recheck labs on 50mcg synthroid  - TSH + free T4  6. B12 deficiency - cyanocobalamin  (VITAMIN B12) injection 1,000 mcg   General Counseling: jelesa mangini understanding of the findings of todays visit and agrees with plan of treatment. I have discussed any further diagnostic evaluation that may be needed or ordered today. We also reviewed her medications today. she has been encouraged to call the office with any questions or concerns that should arise related to todays visit.    Orders Placed This Encounter  Procedures   TSH + free T4   Basic Metabolic Panel (BMET)    Meds ordered this encounter  Medications   cyanocobalamin  (VITAMIN B12) injection 1,000 mcg   lisinopril  (ZESTRIL ) 20 MG tablet    Sig:  Take 1 tablet (20 mg total) by mouth daily.    Dispense:  30 tablet    Refill:  3   furosemide  (LASIX ) 20 MG tablet    Sig: Take 1/2 tab by mouth daily for leg swelling. May increase to full tablet if needed    Dispense:  30 tablet    Refill:  3    This patient was seen by Tinnie Pro, PA-C in collaboration with Dr. Sigrid Bathe as a part of collaborative care agreement.   Total time spent:35 Minutes Time spent includes review of chart, medications, test results, and follow up plan with the patient.      Dr Fozia M Khan Internal medicine

## 2024-07-26 NOTE — Telephone Encounter (Signed)
 Spoke with patient at visit regarding labs.

## 2024-07-26 NOTE — Telephone Encounter (Signed)
-----   Message from Tinnie MARLA Pro sent at 07/23/2024  1:22 PM EDT ----- Please let her know that we need to restart her synthroid  but at a lower dose. I have sent 50mcg for her to take daily. We can discuss the rest of her labs when she comes in later this week, but  wanted to go ahead and send this. ----- Message ----- From: Interface, [facility] Lab Results In Sent: 07/19/2024   7:37 AM EDT To: Tinnie MARLA Pro, PA-C

## 2024-08-01 ENCOUNTER — Ambulatory Visit

## 2024-08-29 ENCOUNTER — Ambulatory Visit (INDEPENDENT_AMBULATORY_CARE_PROVIDER_SITE_OTHER)

## 2024-08-29 DIAGNOSIS — E538 Deficiency of other specified B group vitamins: Secondary | ICD-10-CM | POA: Diagnosis not present

## 2024-08-29 DIAGNOSIS — N184 Chronic kidney disease, stage 4 (severe): Secondary | ICD-10-CM | POA: Diagnosis not present

## 2024-08-29 DIAGNOSIS — E039 Hypothyroidism, unspecified: Secondary | ICD-10-CM | POA: Diagnosis not present

## 2024-08-29 MED ORDER — CYANOCOBALAMIN 1000 MCG/ML IJ SOLN
1000.0000 ug | Freq: Once | INTRAMUSCULAR | Status: AC
  Administered 2024-08-29: 1000 ug via INTRAMUSCULAR

## 2024-08-30 LAB — BASIC METABOLIC PANEL WITH GFR
BUN/Creatinine Ratio: 14 (ref 12–28)
BUN: 38 mg/dL — ABNORMAL HIGH (ref 8–27)
CO2: 22 mmol/L (ref 20–29)
Calcium: 10.2 mg/dL (ref 8.7–10.3)
Chloride: 99 mmol/L (ref 96–106)
Creatinine, Ser: 2.67 mg/dL — ABNORMAL HIGH (ref 0.57–1.00)
Glucose: 93 mg/dL (ref 70–99)
Potassium: 4.9 mmol/L (ref 3.5–5.2)
Sodium: 136 mmol/L (ref 134–144)
eGFR: 17 mL/min/1.73 — ABNORMAL LOW (ref >59)

## 2024-08-30 LAB — TSH+FREE T4
Free T4: 0.75 ng/dL — ABNORMAL LOW (ref 0.82–1.77)
TSH: 47.5 u[IU]/mL — ABNORMAL HIGH (ref 0.450–4.500)

## 2024-09-05 ENCOUNTER — Other Ambulatory Visit: Payer: Self-pay | Admitting: Physician Assistant

## 2024-09-05 DIAGNOSIS — F419 Anxiety disorder, unspecified: Secondary | ICD-10-CM

## 2024-09-06 ENCOUNTER — Ambulatory Visit (INDEPENDENT_AMBULATORY_CARE_PROVIDER_SITE_OTHER): Admitting: Physician Assistant

## 2024-09-06 ENCOUNTER — Encounter: Payer: Self-pay | Admitting: Physician Assistant

## 2024-09-06 VITALS — BP 154/94 | HR 74 | Temp 98.3°F | Resp 16 | Ht 62.0 in | Wt 134.4 lb

## 2024-09-06 DIAGNOSIS — E039 Hypothyroidism, unspecified: Secondary | ICD-10-CM | POA: Diagnosis not present

## 2024-09-06 DIAGNOSIS — J449 Chronic obstructive pulmonary disease, unspecified: Secondary | ICD-10-CM

## 2024-09-06 DIAGNOSIS — N184 Chronic kidney disease, stage 4 (severe): Secondary | ICD-10-CM

## 2024-09-06 DIAGNOSIS — I10 Essential (primary) hypertension: Secondary | ICD-10-CM

## 2024-09-06 DIAGNOSIS — R319 Hematuria, unspecified: Secondary | ICD-10-CM | POA: Diagnosis not present

## 2024-09-06 DIAGNOSIS — N390 Urinary tract infection, site not specified: Secondary | ICD-10-CM

## 2024-09-06 DIAGNOSIS — R300 Dysuria: Secondary | ICD-10-CM | POA: Diagnosis not present

## 2024-09-06 DIAGNOSIS — Z23 Encounter for immunization: Secondary | ICD-10-CM

## 2024-09-06 LAB — POCT URINALYSIS DIPSTICK
Bilirubin, UA: NEGATIVE
Glucose, UA: NEGATIVE
Ketones, UA: POSITIVE
Nitrite, UA: NEGATIVE
Protein, UA: NEGATIVE
Spec Grav, UA: 1.01 (ref 1.010–1.025)
Urobilinogen, UA: 0.2 U/dL
pH, UA: 5 (ref 5.0–8.0)

## 2024-09-06 NOTE — Progress Notes (Signed)
 [facility] [address] [place], [place] [address]  Internal MEDICINE  Office Visit Note  Patient Name: Gail Mora  [id number]  [id number]  Date of Service: 09/07/2024  Chief Complaint  Patient presents with   Follow-up    Review labs   Hypertension   Gastroesophageal Reflux   Hyperlipidemia    HPI Pt is here for routine follow up -labs reviewed: renal function shows further decline though electrolytes now normalized. Thyroid  improving on lower dose synthroid  now -pt reports she is doing well and has no concerns -daughter reports that her step sister thought pt didn't sound quite right on the phone, but wonders if hearing aid not in and wasn't hearing her right. Daughter does not notice any change and talks to her daily. We did discuss checking for UTI though -when specifically asked pt does admit to some burning with urination since last week, not every time though. Has had UTIs before, but not too often. More frequency. No pelvic pain or low back pain. Wears depends at night -BP not checked at home very often, states neighbor checks it for her sometimes -when asked about her bp meds states she is taking the 1/2 tab lasix  and swelling much better, but that she is taking with her old medication. Will clarify meds when she gets home as pt was supposed to switch to lisinopril  with 1/2 lasix  instead of lisinopril -hydrochlorothiazide  and lasix . Discussed impact on kidneys and calcium  and that this was reason for change. May impact her frequency too if taking both. Pt states she does not want more med changes. Discussed hydralazine as good Htn med without impact to kidneys but she declines. -Admits she doesn't drink much water--coffee and arnold palmers and discussed need for water! -daughter is with her and states she has POA for medical decisions. Pt does not have DNR in place but will think about this. -reports B12 shots helping a lot  Current Medication: Outpatient  Encounter Medications as of 09/06/2024  Medication Sig   allopurinol  (ZYLOPRIM ) 100 MG tablet Take 1 tablet (100 mg total) by mouth daily.   ALPRAZolam  (XANAX ) 0.25 MG tablet Take 1 tablet by mouth twice daily as needed for anxiety   aspirin  EC 81 MG tablet Take 81 mg by mouth daily.   BREO ELLIPTA  200-25 MCG/ACT AEPB Inhale 1 puff into the lungs daily.   cetirizine  (ZYRTEC ) 10 MG tablet Take 1 tablet (10 mg total) by mouth daily.   furosemide  (LASIX ) 20 MG tablet Take 1/2 tab by mouth daily for leg swelling. May increase to full tablet if needed   gabapentin  (NEURONTIN ) 100 MG capsule Take 1 capsule (100 mg total) by mouth 2 (two) times daily.   levothyroxine  (SYNTHROID ) 50 MCG tablet Take 1 tablet (50 mcg total) by mouth daily.   lisinopril  (ZESTRIL ) 20 MG tablet Take 1 tablet (20 mg total) by mouth daily.   omeprazole  (PRILOSEC) 40 MG capsule Take 1 capsule (40 mg total) by mouth daily.   rosuvastatin  (CRESTOR ) 40 MG tablet Take 1 tablet (40 mg total) by mouth daily.   [DISCONTINUED] amLODipine  (NORVASC ) 5 MG tablet Take 1 tablet (5 mg total) by mouth daily.   No facility-administered encounter medications on file as of 09/06/2024.    Surgical History: Past Surgical History:  Procedure Laterality Date   ABDOMINAL HYSTERECTOMY     BACK SURGERY     CATARACT EXTRACTION     Mora replacemt  2015   Left Mora   RETINAL TEAR REPAIR  CRYOTHERAPY      Medical History: Past Medical History:  Diagnosis Date   GERD (gastroesophageal reflux disease)    Hyperlipemia    Hypertension    Hypothyroid     Family History: Family History  Problem Relation Age of Onset   Hypertension Mother    Hyperlipidemia Mother    Bladder Cancer Neg Hx    Kidney cancer Neg Hx     Social History   Socioeconomic History   Marital status: Married    Spouse name: Not on file   Number of children: Not on file   Years of education: Not on file   Highest education level: Not on file  Occupational  History   Not on file  Tobacco Use   Smoking status: Former   Smokeless tobacco: Never  Vaping Use   Vaping status: Never Used  Substance and Sexual Activity   Alcohol use: No   Drug use: No   Sexual activity: Not Currently  Other Topics Concern   Not on file  Social History Narrative   Not on file   Social Drivers of Health   Financial Resource Strain: Low Risk  (07/17/2021)   Overall Financial Resource Strain (CARDIA)    Difficulty of Paying Living Expenses: Not very hard  Food Insecurity: No Food Insecurity (07/17/2021)   Hunger Vital Sign    Worried About Running Out of Food in the Last Year: Never true    Ran Out of Food in the Last Year: Never true  Transportation Needs: No Transportation Needs (07/17/2021)   PRAPARE - Administrator, Civil Service (Medical): No    Lack of Transportation (Non-Medical): No  Physical Activity: Insufficiently Active (07/17/2021)   Exercise Vital Sign    Days of Exercise per Week: 2 days    Minutes of Exercise per Session: 20 min  Stress: Stress Concern Present (07/17/2021)   [employer] of Occupational Health - Occupational Stress Questionnaire    Feeling of Stress : To some extent  Social Connections: Unknown (07/17/2021)   Social Connection and Isolation Panel    Frequency of Communication with Friends and Family: More than three times a week    Frequency of Social Gatherings with Friends and Family: More than three times a week    Attends Religious Services: Not on file    Active Member of Clubs or Organizations: Yes    Attends Banker Meetings: More than 4 times per year    Marital Status: Widowed  Intimate Partner Violence: Not At Risk (07/17/2021)   Humiliation, Afraid, Rape, and Kick questionnaire    Fear of Current or Ex-Partner: No    Emotionally Abused: No    Physically Abused: No    Sexually Abused: No      Review of Systems  Constitutional:  Negative for chills, fatigue and unexpected weight  change.  HENT:  Negative for congestion, rhinorrhea, sneezing and sore throat.   Eyes:  Negative for redness.  Respiratory:  Negative for cough, chest tightness and shortness of breath.   Cardiovascular:  Negative for chest pain, palpitations and leg swelling.  Gastrointestinal:  Negative for abdominal pain, constipation, diarrhea, nausea and vomiting.  Genitourinary:  Positive for dysuria and frequency.  Musculoskeletal:  Positive for arthralgias. Negative for back pain, joint swelling and neck pain.  Skin:  Negative for rash.  Neurological: Negative.  Negative for tremors.  Hematological:  Negative for adenopathy. Does not bruise/bleed easily.  Psychiatric/Behavioral:  Negative for behavioral problems (Depression),  sleep disturbance and suicidal ideas. The patient is nervous/anxious.     Vital Signs: BP (!) 154/68   Pulse 74   Temp 98.3 F (36.8 C)   Resp 16   Ht 5' 2 (1.575 m)   Wt 134 lb 6.4 oz (61 kg)   SpO2 96%   BMI 24.58 kg/m    Physical Exam Vitals and nursing note reviewed.  Constitutional:      General: She is not in acute distress.    Appearance: She is well-developed. She is not diaphoretic.  HENT:     Head: Normocephalic and atraumatic.  Eyes:     Extraocular Movements: Extraocular movements intact.  Neck:     Thyroid : No thyromegaly.     Vascular: No JVD.     Trachea: No tracheal deviation.  Cardiovascular:     Rate and Rhythm: Normal rate and regular rhythm.     Heart sounds: Normal heart sounds. No murmur heard.    No friction rub. No gallop.  Pulmonary:     Effort: Pulmonary effort is normal. No respiratory distress.     Breath sounds: No wheezing or rales.  Chest:     Chest wall: No tenderness.  Musculoskeletal:     Right lower leg: No edema.     Left lower leg: No edema.  Skin:    General: Skin is warm and dry.  Neurological:     General: No focal deficit present.     Mental Status: She is alert.  Psychiatric:        Behavior: Behavior  normal.        Thought Content: Thought content normal.        Judgment: Judgment normal.        Assessment/Plan: 1. CKD (chronic kidney disease) stage 4, GFR 15-29 ml/min (HCC) (Primary) Continues to have low renal function and discussed medication impacts anc changes that could help, but pt declines. Still declines nephrology/hematology referrals as well. Encouraged to increase water intake and reduce arnold palmers  2. Essential hypertension Elevated in office, but pt declines further changes. Discussed hydralazine as an option without significant renal impact. Will clarify if taking lisinopril  on its own with 1/2 tab lasix , or if taking lisinopril -hydrochlorothiazide  with 1/2 lasix . Encouraged to avoid hydrochlorothiazide  due to renal impact and calcium .  3. Hypothyroidism, unspecified type Improving and borderline now since restarting medication. Expect it will continue to improve with continued use. Will continue on low dose synthroid  given patient's age and health concerns. No bradycardia or concerns for acute hypothyroid crisis  4. Hypercalcemia Improved since 1/2 lasix  and will verify if hydrochlorothiazide  stopped which may also have helped  5. Urinary tract infection with hematuria, site unspecified Will send for culture, will hold off on ABX given small amount leukocytes on dip and low renal function until known if true infection on culture, however if worsening symptoms advised to call and will go ahead with treatment. Advised on increasing water intake. - CULTURE, URINE COMPREHENSIVE  6. Dysuria - POCT Urinalysis Dipstick  7. Chronic obstructive pulmonary disease, unspecified COPD type (HCC) Continue breo  8. Needs flu shot - Influenza, MDCK, trivalent, PF(Flucelvax egg-free)   General Counseling: avamarie crossley understanding of the findings of todays visit and agrees with plan of treatment. I have discussed any further diagnostic evaluation that may be needed or  ordered today. We also reviewed her medications today. she has been encouraged to call the office with any questions or concerns that should arise related to todays  visit.    Orders Placed This Encounter  Procedures   CULTURE, URINE COMPREHENSIVE   Influenza, MDCK, trivalent, PF(Flucelvax egg-free)   POCT Urinalysis Dipstick    No orders of the defined types were placed in this encounter.   This patient was seen by Tinnie Pro, PA-C in collaboration with Dr. Sigrid Bathe as a part of collaborative care agreement.   Total time spent:40 Minutes Time spent includes review of chart, medications, test results, and follow up plan with the patient.      Dr Fozia M Khan Internal medicine

## 2024-09-07 ENCOUNTER — Telehealth: Payer: Self-pay | Admitting: Physician Assistant

## 2024-09-07 NOTE — Telephone Encounter (Signed)
 Patient called after hours requesting meds for uti. She stated she s/w someone @ office today, but had not heard back. I contacted Alyssa, she explained patient cannot be prescribed anything until we receive urine culture results back. I explained this to patient. She asked if she could just get some OTC. I suggested she not until we get results back. She understood-Toni

## 2024-09-11 ENCOUNTER — Telehealth: Payer: Self-pay

## 2024-09-11 ENCOUNTER — Other Ambulatory Visit: Payer: Self-pay | Admitting: Physician Assistant

## 2024-09-11 DIAGNOSIS — N390 Urinary tract infection, site not specified: Secondary | ICD-10-CM

## 2024-09-11 MED ORDER — CIPROFLOXACIN HCL 250 MG PO TABS: 250.0000 mg | ORAL_TABLET | Freq: Every day | ORAL | 0 refills | Status: AC

## 2024-09-11 NOTE — Telephone Encounter (Signed)
 Notified patient's daughter that per Tinnie Cipro has been sent based on preliminary urine culture report.

## 2024-09-11 NOTE — Telephone Encounter (Signed)
 Patient called again today to request medication for her UTI. I re-explained that because the urine culture has not yet resulted, we can not send ABX without knowing what kind of bacteria is present. Patient has not been symptomatic but wants medication since being told she has a UTI. I explained this to her Friday, Andree did the same over the weekend and I re-explained this morning when the patient called. I also stated urine cultures can take anywhere from 3-5 days to result, today being the 5th day, but patient hung up the phone. Just called patient's daughter and she was very understanding and will try to explain this to her mother as well.

## 2024-09-12 LAB — CULTURE, URINE COMPREHENSIVE

## 2024-10-03 ENCOUNTER — Ambulatory Visit (INDEPENDENT_AMBULATORY_CARE_PROVIDER_SITE_OTHER)

## 2024-10-03 DIAGNOSIS — E538 Deficiency of other specified B group vitamins: Secondary | ICD-10-CM

## 2024-10-03 MED ORDER — CYANOCOBALAMIN 1000 MCG/ML IJ SOLN
1000.0000 ug | Freq: Once | INTRAMUSCULAR | Status: AC
  Administered 2024-10-03: 1000 ug via INTRAMUSCULAR

## 2024-10-23 ENCOUNTER — Other Ambulatory Visit: Payer: Self-pay | Admitting: Physician Assistant

## 2024-10-23 DIAGNOSIS — F419 Anxiety disorder, unspecified: Secondary | ICD-10-CM

## 2024-10-23 DIAGNOSIS — E782 Mixed hyperlipidemia: Secondary | ICD-10-CM

## 2024-10-29 ENCOUNTER — Other Ambulatory Visit: Payer: Self-pay | Admitting: Physician Assistant

## 2024-10-29 DIAGNOSIS — M25511 Pain in right shoulder: Secondary | ICD-10-CM

## 2024-10-31 ENCOUNTER — Ambulatory Visit

## 2024-11-01 ENCOUNTER — Ambulatory Visit: Admitting: Physician Assistant

## 2024-11-01 ENCOUNTER — Encounter: Payer: Self-pay | Admitting: Physician Assistant

## 2024-11-01 VITALS — BP 130/68 | HR 76 | Temp 98.0°F | Resp 16 | Ht 62.0 in | Wt 134.0 lb

## 2024-11-01 DIAGNOSIS — E538 Deficiency of other specified B group vitamins: Secondary | ICD-10-CM | POA: Diagnosis not present

## 2024-11-01 DIAGNOSIS — E039 Hypothyroidism, unspecified: Secondary | ICD-10-CM | POA: Diagnosis not present

## 2024-11-01 DIAGNOSIS — N184 Chronic kidney disease, stage 4 (severe): Secondary | ICD-10-CM | POA: Diagnosis not present

## 2024-11-01 DIAGNOSIS — I10 Essential (primary) hypertension: Secondary | ICD-10-CM | POA: Diagnosis not present

## 2024-11-01 MED ADMIN — Cyanocobalamin Inj 1000 MCG/ML: 1000 ug | INTRAMUSCULAR | @ 12:00:00 | NDC 00517013001

## 2024-11-01 NOTE — Progress Notes (Cosign Needed)
 [facility] [address] [place], [place] [address]  Internal MEDICINE  Office Visit Note  Patient Name: Gail Mora  [id number]  [id number]  Date of Service: 11/01/2024  Chief Complaint  Patient presents with   Follow-up   Gastroesophageal Reflux   Hyperlipidemia   Hypertension    HPI Pt is here for routine follow up, her daughter is with her -doing well today, no complaints -BP much improved--states she went back to taking her original lisinopril -hydrochlorothiazide  and is not taking the separate lisinopril  with prn 1/2 tab lasix . She is doing well being back on this and is not having swelling and would like to stay on this -still declines nephrology and heme-onc referrals -feels better with B12 injections and would like to continue  Current Medication: Outpatient Encounter Medications as of 11/01/2024  Medication Sig   allopurinol  (ZYLOPRIM ) 100 MG tablet Take 1 tablet by mouth once daily   ALPRAZolam  (XANAX ) 0.25 MG tablet Take 1 tablet by mouth twice daily as needed for anxiety   aspirin  EC 81 MG tablet Take 81 mg by mouth daily.   BREO ELLIPTA  200-25 MCG/ACT AEPB Inhale 1 puff into the lungs daily.   cetirizine  (ZYRTEC ) 10 MG tablet Take 1 tablet (10 mg total) by mouth daily.   gabapentin  (NEURONTIN ) 100 MG capsule Take 1 capsule (100 mg total) by mouth 2 (two) times daily.   levothyroxine  (SYNTHROID ) 50 MCG tablet Take 1 tablet (50 mcg total) by mouth daily.   lisinopril -hydrochlorothiazide  (ZESTORETIC ) 20-25 MG tablet Take 1 tablet by mouth daily.   omeprazole  (PRILOSEC) 40 MG capsule Take 1 capsule (40 mg total) by mouth daily.   rosuvastatin  (CRESTOR ) 40 MG tablet Take 1 tablet by mouth once daily   [DISCONTINUED] furosemide  (LASIX ) 20 MG tablet Take 1/2 tab by mouth daily for leg swelling. May increase to full tablet if needed   [DISCONTINUED] lisinopril  (ZESTRIL ) 20 MG tablet Take 1 tablet (20 mg total) by mouth daily.   [EXPIRED] cyanocobalamin   (VITAMIN B12) injection 1,000 mcg    No facility-administered encounter medications on file as of 11/01/2024.    Surgical History: Past Surgical History:  Procedure Laterality Date   ABDOMINAL HYSTERECTOMY     BACK SURGERY     CATARACT EXTRACTION     Knee replacemt  2015   Left knee   RETINAL TEAR REPAIR CRYOTHERAPY      Medical History: Past Medical History:  Diagnosis Date   GERD (gastroesophageal reflux disease)    Hyperlipemia    Hypertension    Hypothyroid     Family History: Family History  Problem Relation Age of Onset   Hypertension Mother    Hyperlipidemia Mother    Bladder Cancer Neg Hx    Kidney cancer Neg Hx     Social History   Socioeconomic History   Marital status: Married    Spouse name: Not on file   Number of children: Not on file   Years of education: Not on file   Highest education level: Not on file  Occupational History   Not on file  Tobacco Use   Smoking status: Former   Smokeless tobacco: Never  Vaping Use   Vaping status: Never Used  Substance and Sexual Activity   Alcohol use: No   Drug use: No   Sexual activity: Not Currently  Other Topics Concern   Not on file  Social History Narrative   Not on file   Social Drivers of Health   Tobacco Use:  Medium Risk (11/01/2024)   Patient History    Smoking Tobacco Use: Former    Smokeless Tobacco Use: Never    Passive Exposure: Not on Actuary Strain: Not on file  Food Insecurity: Not on file  Transportation Needs: Not on file  Physical Activity: Not on file  Stress: Not on file  Social Connections: Not on file  Intimate Partner Violence: Not on file  Depression (PHQ2-9): Low Risk (11/01/2024)   Depression (PHQ2-9)    PHQ-2 Score: 0  Alcohol Screen: Not on file  Housing: Not on file  Utilities: Not on file  Health Literacy: Not on file      Review of Systems  Constitutional:  Negative for chills, fatigue and unexpected weight change.  HENT:  Negative  for congestion, rhinorrhea, sneezing and sore throat.   Eyes:  Negative for redness.  Respiratory:  Negative for cough, chest tightness and shortness of breath.   Cardiovascular:  Negative for chest pain, palpitations and leg swelling.  Gastrointestinal:  Negative for abdominal pain, constipation, diarrhea, nausea and vomiting.  Genitourinary:  Negative for dysuria.  Musculoskeletal:  Positive for arthralgias. Negative for back pain, joint swelling and neck pain.  Skin:  Negative for rash.  Neurological: Negative.  Negative for tremors.  Hematological:  Negative for adenopathy. Does not bruise/bleed easily.  Psychiatric/Behavioral:  Negative for behavioral problems (Depression), sleep disturbance and suicidal ideas. The patient is nervous/anxious.     Vital Signs: BP 130/68   Pulse 76   Temp 98 F (36.7 C)   Resp 16   Ht 5' 2 (1.575 m)   Wt 134 lb (60.8 kg)   SpO2 98%   BMI 24.51 kg/m    Physical Exam Vitals and nursing note reviewed.  Constitutional:      General: She is not in acute distress.    Appearance: She is well-developed. She is not diaphoretic.  HENT:     Head: Normocephalic and atraumatic.  Eyes:     Extraocular Movements: Extraocular movements intact.  Neck:     Thyroid : No thyromegaly.     Vascular: No JVD.     Trachea: No tracheal deviation.  Cardiovascular:     Rate and Rhythm: Normal rate and regular rhythm.     Heart sounds: Normal heart sounds. No murmur heard.    No friction rub. No gallop.  Pulmonary:     Effort: Pulmonary effort is normal. No respiratory distress.     Breath sounds: No wheezing or rales.  Chest:     Chest wall: No tenderness.  Musculoskeletal:     Right lower leg: No edema.     Left lower leg: No edema.  Skin:    General: Skin is warm and dry.  Neurological:     General: No focal deficit present.     Mental Status: She is alert.  Psychiatric:        Behavior: Behavior normal.        Thought Content: Thought content  normal.        Judgment: Judgment normal.        Assessment/Plan: 1. Essential hypertension (Primary) Well controlled, pt declines other med changes and wants to stay on hyzaar  2. CKD (chronic kidney disease) stage 4, GFR 15-29 ml/min (HCC) Will order labs for monitoring. Pt has declined referrals. Also discussed med changes that could help, but wants to stay on current meds. She understands that if kidney function reduces further she would be considered end stage renal  failure and typically dialysis would be needed. States she would not do this. - Basic Metabolic Panel (BMET)  3. Hypothyroidism, unspecified type Will continue to monitor - TSH + free T4  4. B12 deficiency - cyanocobalamin  (VITAMIN B12) injection 1,000 mcg   General Counseling: darlynn ricco understanding of the findings of todays visit and agrees with plan of treatment. I have discussed any further diagnostic evaluation that may be needed or ordered today. We also reviewed her medications today. she has been encouraged to call the office with any questions or concerns that should arise related to todays visit.    Orders Placed This Encounter  Procedures   TSH + free T4   Basic Metabolic Panel (BMET)    Meds ordered this encounter  Medications   cyanocobalamin  (VITAMIN B12) injection 1,000 mcg    This patient was seen by Tinnie Pro, PA-C in collaboration with Dr. Sigrid Bathe as a part of collaborative care agreement.   Total time spent:30 Minutes Time spent includes review of chart, medications, test results, and follow up plan with the patient.      Dr Fozia M Khan Internal medicine

## 2024-12-03 ENCOUNTER — Ambulatory Visit (INDEPENDENT_AMBULATORY_CARE_PROVIDER_SITE_OTHER)

## 2024-12-03 DIAGNOSIS — E538 Deficiency of other specified B group vitamins: Secondary | ICD-10-CM | POA: Diagnosis not present

## 2024-12-03 MED ORDER — CYANOCOBALAMIN 1000 MCG/ML IJ SOLN
1000.0000 ug | Freq: Once | INTRAMUSCULAR | Status: AC
  Administered 2024-12-03: 1000 ug via INTRAMUSCULAR

## 2024-12-07 ENCOUNTER — Other Ambulatory Visit: Payer: Self-pay | Admitting: Physician Assistant

## 2024-12-07 DIAGNOSIS — F419 Anxiety disorder, unspecified: Secondary | ICD-10-CM

## 2025-01-03 ENCOUNTER — Ambulatory Visit

## 2025-01-31 ENCOUNTER — Ambulatory Visit: Admitting: Physician Assistant

## 2025-05-23 ENCOUNTER — Ambulatory Visit: Admitting: Physician Assistant
